# Patient Record
Sex: Female | Born: 1975 | Race: Black or African American | Hispanic: No | Marital: Married | Smoking: Current every day smoker
Health system: Southern US, Community
[De-identification: ages and names within clinical notes are randomized; demographics above are authoritative.]

## PROBLEM LIST (undated history)

## (undated) DIAGNOSIS — R002 Palpitations: Secondary | ICD-10-CM

## (undated) DIAGNOSIS — R0789 Other chest pain: Secondary | ICD-10-CM

## (undated) HISTORY — PX: TUBAL LIGATION: SHX77

## (undated) HISTORY — DX: Other chest pain: R07.89

## (undated) HISTORY — DX: Palpitations: R00.2

## (undated) HISTORY — PX: BREAST REDUCTION SURGERY: SHX8

## (undated) HISTORY — PX: BREAST SURGERY: SHX581

---

## 1998-06-07 ENCOUNTER — Emergency Department (HOSPITAL_COMMUNITY): Admission: EM | Admit: 1998-06-07 | Discharge: 1998-06-07 | Payer: Self-pay | Admitting: Emergency Medicine

## 1998-09-01 ENCOUNTER — Encounter: Payer: Self-pay | Admitting: Emergency Medicine

## 1998-09-01 ENCOUNTER — Emergency Department (HOSPITAL_COMMUNITY): Admission: EM | Admit: 1998-09-01 | Discharge: 1998-09-01 | Payer: Self-pay | Admitting: Emergency Medicine

## 2000-01-07 ENCOUNTER — Emergency Department (HOSPITAL_COMMUNITY): Admission: EM | Admit: 2000-01-07 | Discharge: 2000-01-07 | Payer: Self-pay | Admitting: Emergency Medicine

## 2000-07-07 ENCOUNTER — Emergency Department (HOSPITAL_COMMUNITY): Admission: EM | Admit: 2000-07-07 | Discharge: 2000-07-07 | Payer: Self-pay | Admitting: *Deleted

## 2000-07-13 ENCOUNTER — Inpatient Hospital Stay (HOSPITAL_COMMUNITY): Admission: AD | Admit: 2000-07-13 | Discharge: 2000-07-13 | Payer: Self-pay | Admitting: *Deleted

## 2000-08-20 ENCOUNTER — Inpatient Hospital Stay (HOSPITAL_COMMUNITY): Admission: EM | Admit: 2000-08-20 | Discharge: 2000-08-22 | Payer: Self-pay | Admitting: *Deleted

## 2000-08-30 ENCOUNTER — Ambulatory Visit (HOSPITAL_COMMUNITY): Admission: RE | Admit: 2000-08-30 | Discharge: 2000-08-30 | Payer: Self-pay | Admitting: *Deleted

## 2000-09-03 ENCOUNTER — Inpatient Hospital Stay (HOSPITAL_COMMUNITY): Admission: AD | Admit: 2000-09-03 | Discharge: 2000-09-03 | Payer: Self-pay | Admitting: Obstetrics

## 2000-09-12 ENCOUNTER — Encounter: Payer: Self-pay | Admitting: Emergency Medicine

## 2000-09-12 ENCOUNTER — Emergency Department (HOSPITAL_COMMUNITY): Admission: EM | Admit: 2000-09-12 | Discharge: 2000-09-12 | Payer: Self-pay | Admitting: Emergency Medicine

## 2000-10-06 ENCOUNTER — Inpatient Hospital Stay (HOSPITAL_COMMUNITY): Admission: AD | Admit: 2000-10-06 | Discharge: 2000-10-06 | Payer: Self-pay | Admitting: Obstetrics

## 2000-10-25 ENCOUNTER — Inpatient Hospital Stay (HOSPITAL_COMMUNITY): Admission: AD | Admit: 2000-10-25 | Discharge: 2000-10-25 | Payer: Self-pay | Admitting: Obstetrics & Gynecology

## 2000-11-05 ENCOUNTER — Inpatient Hospital Stay (HOSPITAL_COMMUNITY): Admission: AD | Admit: 2000-11-05 | Discharge: 2000-11-05 | Payer: Self-pay | Admitting: Obstetrics

## 2000-11-16 ENCOUNTER — Inpatient Hospital Stay (HOSPITAL_COMMUNITY): Admission: AD | Admit: 2000-11-16 | Discharge: 2000-11-19 | Payer: Self-pay | Admitting: *Deleted

## 2000-11-17 ENCOUNTER — Encounter: Payer: Self-pay | Admitting: *Deleted

## 2000-11-29 ENCOUNTER — Encounter: Admission: RE | Admit: 2000-11-29 | Discharge: 2000-11-29 | Payer: Self-pay | Admitting: Obstetrics

## 2000-12-13 ENCOUNTER — Encounter: Admission: RE | Admit: 2000-12-13 | Discharge: 2000-12-13 | Payer: Self-pay | Admitting: Obstetrics

## 2000-12-25 ENCOUNTER — Inpatient Hospital Stay (HOSPITAL_COMMUNITY): Admission: RE | Admit: 2000-12-25 | Discharge: 2000-12-25 | Payer: Self-pay | Admitting: Obstetrics

## 2000-12-27 ENCOUNTER — Encounter (HOSPITAL_COMMUNITY): Admission: RE | Admit: 2000-12-27 | Discharge: 2001-01-16 | Payer: Self-pay | Admitting: Obstetrics

## 2000-12-27 ENCOUNTER — Encounter: Admission: RE | Admit: 2000-12-27 | Discharge: 2000-12-27 | Payer: Self-pay | Admitting: Obstetrics

## 2001-01-01 ENCOUNTER — Inpatient Hospital Stay (HOSPITAL_COMMUNITY): Admission: AD | Admit: 2001-01-01 | Discharge: 2001-01-01 | Payer: Self-pay | Admitting: *Deleted

## 2001-01-03 ENCOUNTER — Encounter: Admission: RE | Admit: 2001-01-03 | Discharge: 2001-01-03 | Payer: Self-pay | Admitting: Obstetrics

## 2001-01-05 ENCOUNTER — Inpatient Hospital Stay (HOSPITAL_COMMUNITY): Admission: AD | Admit: 2001-01-05 | Discharge: 2001-01-05 | Payer: Self-pay | Admitting: *Deleted

## 2001-01-10 ENCOUNTER — Encounter: Admission: RE | Admit: 2001-01-10 | Discharge: 2001-01-10 | Payer: Self-pay | Admitting: Obstetrics

## 2001-01-14 ENCOUNTER — Inpatient Hospital Stay (HOSPITAL_COMMUNITY): Admission: AD | Admit: 2001-01-14 | Discharge: 2001-01-17 | Payer: Self-pay | Admitting: Obstetrics & Gynecology

## 2001-04-06 ENCOUNTER — Emergency Department (HOSPITAL_COMMUNITY): Admission: EM | Admit: 2001-04-06 | Discharge: 2001-04-06 | Payer: Self-pay | Admitting: Emergency Medicine

## 2001-07-20 ENCOUNTER — Emergency Department (HOSPITAL_COMMUNITY): Admission: EM | Admit: 2001-07-20 | Discharge: 2001-07-20 | Payer: Self-pay | Admitting: Emergency Medicine

## 2002-09-13 ENCOUNTER — Emergency Department (HOSPITAL_COMMUNITY): Admission: EM | Admit: 2002-09-13 | Discharge: 2002-09-13 | Payer: Self-pay | Admitting: Emergency Medicine

## 2002-11-17 ENCOUNTER — Encounter (INDEPENDENT_AMBULATORY_CARE_PROVIDER_SITE_OTHER): Payer: Self-pay | Admitting: *Deleted

## 2002-11-17 ENCOUNTER — Ambulatory Visit (HOSPITAL_BASED_OUTPATIENT_CLINIC_OR_DEPARTMENT_OTHER): Admission: RE | Admit: 2002-11-17 | Discharge: 2002-11-18 | Payer: Self-pay | Admitting: Specialist

## 2003-03-18 ENCOUNTER — Emergency Department (HOSPITAL_COMMUNITY): Admission: EM | Admit: 2003-03-18 | Discharge: 2003-03-18 | Payer: Self-pay | Admitting: Emergency Medicine

## 2003-03-18 ENCOUNTER — Encounter: Payer: Self-pay | Admitting: Emergency Medicine

## 2003-06-12 ENCOUNTER — Emergency Department (HOSPITAL_COMMUNITY): Admission: EM | Admit: 2003-06-12 | Discharge: 2003-06-12 | Payer: Self-pay

## 2003-06-12 ENCOUNTER — Encounter: Payer: Self-pay | Admitting: *Deleted

## 2004-04-27 ENCOUNTER — Emergency Department (HOSPITAL_COMMUNITY): Admission: EM | Admit: 2004-04-27 | Discharge: 2004-04-28 | Payer: Self-pay | Admitting: Emergency Medicine

## 2005-03-08 ENCOUNTER — Emergency Department (HOSPITAL_COMMUNITY): Admission: EM | Admit: 2005-03-08 | Discharge: 2005-03-08 | Payer: Self-pay | Admitting: Family Medicine

## 2005-08-25 ENCOUNTER — Emergency Department (HOSPITAL_COMMUNITY): Admission: EM | Admit: 2005-08-25 | Discharge: 2005-08-25 | Payer: Self-pay | Admitting: Family Medicine

## 2007-07-20 ENCOUNTER — Emergency Department (HOSPITAL_COMMUNITY): Admission: EM | Admit: 2007-07-20 | Discharge: 2007-07-20 | Payer: Self-pay | Admitting: Emergency Medicine

## 2007-07-21 ENCOUNTER — Emergency Department (HOSPITAL_COMMUNITY): Admission: EM | Admit: 2007-07-21 | Discharge: 2007-07-21 | Payer: Self-pay | Admitting: Emergency Medicine

## 2008-03-27 ENCOUNTER — Inpatient Hospital Stay (HOSPITAL_COMMUNITY): Admission: AD | Admit: 2008-03-27 | Discharge: 2008-03-27 | Payer: Self-pay | Admitting: Obstetrics

## 2008-04-28 ENCOUNTER — Emergency Department (HOSPITAL_COMMUNITY): Admission: EM | Admit: 2008-04-28 | Discharge: 2008-04-28 | Payer: Self-pay | Admitting: Family Medicine

## 2008-06-13 ENCOUNTER — Inpatient Hospital Stay (HOSPITAL_COMMUNITY): Admission: AD | Admit: 2008-06-13 | Discharge: 2008-06-13 | Payer: Self-pay | Admitting: Obstetrics

## 2008-08-21 ENCOUNTER — Inpatient Hospital Stay (HOSPITAL_COMMUNITY): Admission: AD | Admit: 2008-08-21 | Discharge: 2008-08-21 | Payer: Self-pay | Admitting: Obstetrics

## 2008-11-16 ENCOUNTER — Inpatient Hospital Stay (HOSPITAL_COMMUNITY): Admission: AD | Admit: 2008-11-16 | Discharge: 2008-11-18 | Payer: Self-pay | Admitting: Obstetrics

## 2008-11-17 ENCOUNTER — Encounter (INDEPENDENT_AMBULATORY_CARE_PROVIDER_SITE_OTHER): Payer: Self-pay | Admitting: Obstetrics and Gynecology

## 2009-05-24 ENCOUNTER — Emergency Department (HOSPITAL_COMMUNITY): Admission: EM | Admit: 2009-05-24 | Discharge: 2009-05-24 | Payer: Self-pay | Admitting: Family Medicine

## 2009-07-27 ENCOUNTER — Emergency Department (HOSPITAL_COMMUNITY): Admission: EM | Admit: 2009-07-27 | Discharge: 2009-07-27 | Payer: Self-pay | Admitting: Emergency Medicine

## 2009-10-28 ENCOUNTER — Encounter: Admission: RE | Admit: 2009-10-28 | Discharge: 2009-10-28 | Payer: Self-pay | Admitting: Family Medicine

## 2011-02-12 LAB — POCT PREGNANCY, URINE

## 2011-02-20 LAB — CBC
HCT: 32.1 % — ABNORMAL LOW (ref 36.0–46.0)
Hemoglobin: 10.8 g/dL — ABNORMAL LOW (ref 12.0–15.0)
Hemoglobin: 11.7 g/dL — ABNORMAL LOW (ref 12.0–15.0)
MCV: 99.8 fL (ref 78.0–100.0)
RBC: 3.21 MIL/uL — ABNORMAL LOW (ref 3.87–5.11)
RBC: 3.55 MIL/uL — ABNORMAL LOW (ref 3.87–5.11)
RDW: 12.6 % (ref 11.5–15.5)
WBC: 10.8 10*3/uL — ABNORMAL HIGH (ref 4.0–10.5)
WBC: 7.6 10*3/uL (ref 4.0–10.5)

## 2011-03-21 NOTE — Op Note (Signed)
NAMEDEBHORA, Sharon Bryant              ACCOUNT NO.:  [id number]   MEDICAL RECORD NO.:  [id number]          PATIENT TYPE:  INP   LOCATION:  9109                          FACILITY:  [facility]   PHYSICIAN:  Kathreen Cosier, M.D.DATE OF BIRTH:  1976-08-15   DATE OF PROCEDURE:  11/17/2008  DATE OF DISCHARGE:                               OPERATIVE REPORT   PREOPERATIVE DIAGNOSIS:  Multiparity.   POSTOPERATIVE DIAGNOSIS:  Multiparity.   SURGEON:  Kathreen Cosier, M.D.   PROCEDURE:  Postpartum tubal ligation.   The patient was placed on the operating table in supine position after  the epidural dose.  A midline subumbilical incision 1-inch long was made  and carried down through the fascia.  The fascia cleaned, grasped with 2  Kocher's.  Fascia and the peritoneum were opened with Mayo scissors.  Left tube grasped in the mid portion with a Babcock clamp, 0 plain  suture placed at the mesosalpinx below the portion of the tube and  clamp.  This was tied and approximately 1 inch of tube transected.  Procedure was done in the similar fashion in the other side.  Hemostasis  was satisfactory.  Lap and sponge counts correct.  Abdomen was closed in  layers.  Peritoneum and fascia, continuous suture of 0 Dexon.  Skin  closed with subcuticular stitch of 4-0 Monocryl.  Blood loss minimal.  The patient tolerated the procedure well and was taken to recovery room  in good condition.           ______________________________  Kathreen Cosier, M.D.     BAM/MEDQ  D:  11/17/2008  T:  11/18/2008  Job:  [id number]

## 2011-03-24 NOTE — Discharge Summary (Signed)
[facility]  Patient:    Sharon Bryant, Sharon Bryant                     MRN: [id number] Adm. Date:  [id number] Disc. Date: [id number] Attending:  Sandi Raveling                           Discharge Summary  BRIEF HISTORY:  Patient is a 35 year old single black female admitted with a history of depression and suicide attempt.  She reported feelings of depression and had suicidal thoughts and overdosed on eight Tylenol PM tablets.  She spoke to her sister who called EMS and she was taken to the emergency department.  The patient reported multiple stressors with her job and relationships.  She reported that she had been sleeping fairly well and that her appetite had been good.  She felt hopeless about her future.  Patient had history of depression since the age of 33.  She had been on medication in the past but was unsure of which type.  She had been admitted to [facility] years ago for depression and also been followed through the [facility].  She was [facility] medically [facility] [place].  She had no medical problems other than intrauterine pregnancy.  She was on prenatal vitamins.  She had no known drug allergies.  PHYSICAL EXAMINATION:  Performed at [facility] Emergency Department with no significant findings.  LABORATORY VALUES:  Her acetaminophen level was 18.2.  MENTAL STATUS EXAMINATION:  On admission, revealed an alert and casually-dressed, young black female.  Speech was normal.  Thought processes were logical and coherent without evidence of psychosis.  She had some suicidal thoughts but no intent.  Mood was depressed.  Affect was sad. Oriented x 3.  Cognitive function was intact.  ADMITTING DIAGNOSES: Axis I:    Depression not otherwise specified. Axis II:   Deferred. Axis III:  Intrauterine pregnancy. Axis IV:   Psychosocial stressors moderate. Axis V:    Global Assessment of Functioning:  Current 30;  highest in past year            60.  TREATMENT PLAN:  The patient was admitted for treatment of her depression and to prevent any suicidal acting out.  She was in groups and was able to talk about some of her stressors at home.  She did not wish to get on antidepressant medications.  She continued to have some difficulty sleeping but her appetite was good.  She was denying suicidal ideation.  Her mood and affect seemed brighter.  We respected her wish to remain off of antidepressants but she did agree to talk with her obstetrician about that possibility.  CONDITION ON DISCHARGE:  Patient discharged in mildly improved condition with some improvement in her mood and alleviation of her acute suicidal ideation.  DISPOSITION:  Patient is discharged home.  She was to follow up with [facility].  DISCHARGE MEDICATIONS:  Prenatal vitamin 1 q.d.  FINAL DIAGNOSES: Axis I:    Depression not otherwise specified. Axis II:   No diagnosis. Axis III:  Intrauterine pregnancy. Axis IV:   Psychosocial stressors moderate. Axis V:    Global Assessment of Functioning:  Current 50; highest past year            60. DD:  10/02/00 TD:  10/02/00 Job: [id number]

## 2011-03-24 NOTE — H&P (Signed)
[facility]  Patient:    Sharon Bryant, Sharon Bryant                     MRN: [id number] Adm. Date:  [id number] Attending:  Otilio Saber Dictator:   Landry Corporal, NP                   Psychiatric Admission Assessment  DATE OF ADMISSION:  August 20, 2000.  PATIENT IDENTIFICATION:  This is a 35 year old black single female admitted on August 20, 2000 to [facility] for depression and suicide attempt.  HISTORY OF PRESENT ILLNESS:  Patient reports having feelings of depression and feeling "pent up" with depression and decided to kill herself.  Yesterday, she took about 8 Tylenol PM tablets, spoke to her sitter, who called EMS and she was taken to the emergency department.  Patient reports she has been having multiple stressors.  She would not really elaborate as to what they would. She did sort of make references to her job and to her relationships at home. She does state that she has been sleeping OK, that her appetite has been good. No homicidal ideation, negative auditory or visual hallucinations, negative paranoia.  She feels hopeless, with no future in sight.  PAST PSYCHIATRIC HISTORY:  She says she has a history of depression since the age of 32.  She was placed on some medication but was uncertain as to what the medication was.  She also had an admission to [facility] years ago for for depression and was also going to [facility] center. Again, this was remote problems that she states.  SUBSTANCE ABUSE HISTORY:  No alcohol use and denies street drugs.  PAST MEDICAL HISTORY:  Primary care physician:  She goes to [facility] in [place].  Her medical problems:  None, other than her intrauterine pregnancy.  Medications:  She takes prenatal vitamins.  Drug allergies:  None.  PHYSICAL EXAMINATION:  Her physical examination was done at [facility] emergency department.  Her acetaminophen level was 18.2.  SOCIAL  HISTORY:  A 35 year old black female.  She is single.  She has a relationship with a female for the past 7-8 years.  She has two children, ages 53 and 2.  She says he is the father of both of these children, and she is also pregnant x 12 and he is the father of this child as well.  Her estimated date of confinement is March 05, 2001.  She works as a Conservation officer, nature at [employer].  She has a ninth grade education.  She smokes one half pack for about six years.  She lives with her father and two children.  FAMILY HISTORY:  No psychiatric problems that she is aware of.  MENTAL STATUS EXAMINATION:  She is alert and casually dressed young black female, fair eye contact.  Speech is normal and relevant.  Her mood is depressed.  Her affect is sad.  Thought processes are intact.  No evidence of psychosis.  Negative auditory or visual hallucinations.  Cognitive, intact x 3.  Her memory is intact.  Her judgment is poor.  Her insight is poor.  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 Axis II:   Deferred. Axis III:  Intrauterine pregnancy. Axis IV:   Moderate, related to problems with primary support group, social            environment, occupational problem, and economic problems. Axis V:  Current global assessment of function is 30.  INITIAL PLAN OF CARE:  Voluntary admission to [facility] for depression and suicide attempt.  She is to contract for safety, check every 15 minutes.  Patient is in agreement.  We will also obtain a thyroid profile and begin her prenatal vitamins.  ESTIMATED LENGTH OF STAY:  Three to four days.  CONDITIONS NECESSARY FOR DISCHARGE:  POST HOSPITAL CARE PLAN: DD:  08/21/00 TD:  08/21/00 Job: [id number]

## 2011-03-24 NOTE — Op Note (Signed)
NAMEDASHIA, Sharon Bryant                        ACCOUNT NO.:  [id number]   MEDICAL RECORD NO.:  [id number]                   PATIENT TYPE:  AMB   LOCATION:  DSC                                  FACILITY:  [facility]   PHYSICIAN:  Earvin Hansen L. Shon Hough, M.D.           DATE OF BIRTH:  [birth year]   DATE OF PROCEDURE:  11/17/2002  DATE OF DISCHARGE:                                 OPERATIVE REPORT   INDICATIONS FOR PROCEDURE:  This is a 35 year old lady with severe, severe  macromastia, back and shoulder pain secondary to large pendulous breasts -  the left side greater than the right.   PROCEDURES PLANNED:  Bilateral breast reductions using the inferior pedicle  technique.   ANESTHESIA:  General.   SURGEON:  Gerald L. Shon Hough, M.D.   ASSISTANT:  Alethia Berthold, C.F.A.   DESCRIPTION OF PROCEDURE:  Preoperatively the patient was set up and drawn  for the inferior pedicle reduction mammoplasty, remarking the nipple areolar  complexes from over 38 cm up to 20 cm from the suprasternal notch.  She then  underwent general anesthesia and intubated orally.  Prep was done to the  chest and breast areas in a routine fashion using Betadine soap and  solution, walled off with sterile towels, and draped so as to make a sterile  field.  Xylocaine 0.25% with epinephrine was injected locally - a total of  150 cc per side.  The wounds were scored with #20 blades.  Then, the skin  over the inferior pedicles was de-epithelialized with #20 blades.  The  medial and lateral fatty dermal pedicles were excised down to underlying  fascia.  After this, out laterally more tissue was taken to improve the  lateral contour.  The new keyhole was also debulked.  After this and after  proper hemostasis the flaps were transposed and stayed with 3-0 Prolene,  subcutaneous closure was done with 3-0 Monocryl x2 layers, then a running  subcuticular stitch of 3-0 Monocryl throughout the inverted T.  The same  procedure was  carried out on both sides, removing over 1500 g on the left  and over 1100 g on the right.  The wounds were cleanses.  Benzoin was  applied and then half inch Steri-Strips, Xeroform, 4x4's, ABDs, and Hypafix  tape.  The wounds were also drained with #10 Blake drains, fully fluted,  which were placed in the depths of the wound and brought out through the  lateral-most portion of the incision and secured with 3-0 Prolene.  The  patient was then taken to recovery in excellent condition.   DISPOSITION:  She will be admitted for overnight stay and then will be  discharged tomorrow, follow up in the office on this Thursday for  reexamination.  Yaakov Guthrie. Shon Hough, M.D.    Cathie Hoops  D:  11/17/2002  T:  11/17/2002  Job:  [id number]

## 2011-03-24 NOTE — Discharge Summary (Signed)
[facility]  Patient:    Sharon Bryant, Sharon Bryant                     MRN: [id number] Adm. Date:  [id number] Disc. Date: 11/19/00 Attending:  Michaelle Copas Dictator:   Jamey Reas, M.D.                           Discharge Summary  DATE OF BIRTH:                02/06/1976  ADMISSION DIAGNOSES:          1. Preterm contractions.                               2. Positive group B strep.  DISCHARGE DIAGNOSES:          1. Preterm contractions.                               2. Positive group B strep.  REFERRING PHYSICIAN:          [facility], [facility].  CONSULTATIONS:                None.  PROCEDURES:                   OB ultrasound on November 17, 2000, showed single fetus, female, in cephalic presentation, grade 1 placenta, normal AFI at 13.8, normal growth parameters at 39th percentile for 29-week fetus.  Cervical length was found to be 3.7 cm.  HOSPITAL COURSE:              The patient was admitted on January 11, for preterm contractions.  The patient had a wet prep and GBS.  GC and Chlamydia cultures were canceled.  Wet prep showed only a few wbcs with moderate bacteria, otherwise negative for Trichomonas, clue cells.  The patient was started on Unasyn 3 g IV q.6h.  This was continued through day #2 of hospitalization at which time her IV infiltrated.  She was taken off of the Unasyn at that time.  The patient remained with only uterine irritability on occasion throughout hospitalization.  She was not noted to contract significantly throughout most of her hospitalization.  She is a G3, P 2-0-0-2 at 29 and 4/7 at the time of discharge.  Urinalysis on admission was within normal limits.  Her contractions on admission had been times several weeks, but increased on the morning and had become painful on the morning of admission.  She noted intact membranes and had normal fetal activity.  DISCHARGE  INSTRUCTIONS:       The patient will be discharged home on preterm labor precautions with bed rest with only bathroom privileges.  She will remain out of work until seen for her follow-up appointment.  DISCHARGE MEDICATIONS:        Prenatal vitamins and iron.  She will take TUMS p.r.n.  DISCHARGE FOLLOWUP:           Instructions as above.  Follow up with high risk clinic, Dr. Clearance Coots, scheduled for November 29, 2000, at 9 a.m. DD:  11/19/00 TD:  11/19/00 Job: [id number]

## 2011-08-02 LAB — CBC
HCT: 34.5 — ABNORMAL LOW
Hemoglobin: 11.9 — ABNORMAL LOW
MCHC: 34.4
RBC: 3.58 — ABNORMAL LOW

## 2011-08-02 LAB — WET PREP, GENITAL
Trich, Wet Prep: NONE SEEN
Yeast Wet Prep HPF POC: NONE SEEN

## 2011-08-02 LAB — ABO/RH: ABO/RH(D): O POS

## 2011-08-02 LAB — POCT PREGNANCY, URINE: Operator id: 242691

## 2011-08-02 LAB — GC/CHLAMYDIA PROBE AMP, GENITAL

## 2011-08-07 LAB — URINALYSIS, ROUTINE W REFLEX MICROSCOPIC
Glucose, UA: NEGATIVE
Hgb urine dipstick: NEGATIVE
Ketones, ur: 15 — AB
Protein, ur: NEGATIVE

## 2011-12-11 ENCOUNTER — Ambulatory Visit: Payer: Managed Care, Other (non HMO) | Admitting: Family Medicine

## 2011-12-11 VITALS — BP 124/76 | HR 82 | Temp 98.1°F | Resp 16 | Ht 64.0 in | Wt 193.4 lb

## 2011-12-11 DIAGNOSIS — Z0000 Encounter for general adult medical examination without abnormal findings: Secondary | ICD-10-CM

## 2011-12-11 DIAGNOSIS — B001 Herpesviral vesicular dermatitis: Secondary | ICD-10-CM

## 2011-12-11 DIAGNOSIS — B009 Herpesviral infection, unspecified: Secondary | ICD-10-CM

## 2011-12-11 MED ORDER — FAMCICLOVIR 500 MG PO TABS: 250.0000 mg | ORAL_TABLET | Freq: Three times a day (TID) | ORAL | Status: AC

## 2011-12-11 NOTE — Progress Notes (Signed)
This is a 36 year old woman who works at [employer]. She presents today for swelling and irritation on her upper lip. This began 48 hours prior to arrival. She feels embarrassed to go to work.  Objective: Patient has a small group of vesicular blisters on her upper lip and moderate upper lip swelling. She also has a enlarged sub mental lymph node.  Assessment: HSV 1, acute.  Plan: Famvir as noted.

## 2011-12-11 NOTE — Patient Instructions (Signed)
Fever Blisters, Herpes Simplex Herpes simplex is a virus. This virus causes fever blisters or cold sores. Fever blisters are small sores on the lips, gums, or roof of the mouth. People often get infected with this herpes virus but do not have any symptoms. The blisters may break out when a person is:  Tired.   Under stress.   Suffering from another infection (such as a cold).   Exposed to sunlight.  The blisters usually heal within 1 week. The virus can be easily passed to other people and to other parts of the body, such as the eyes and sex organs. CAUSES  A virus, herpes simplex, is the cause of fever blisters. This virus can be passed (transmitted) from person to person and is therefore contagious. There are 2 types of herpes simplex virus. Type 1 usually causes oral herpes or fever blisters. Type 2 usually causes genital herpes. Both viruses do have the potential to cause oral and genital infections. However, the type 1 virus causes more than 90% of recurrent fever blister outbreaks.  Herpes simplex virus is highly contagious when fever blisters are present. Close contact, including kissing, can spread the virus. Children often become infected by contact with others who have fever blisters. A child can spread the virus by rubbing the cold sore and touching other children or when other children touch clothing, wipes, or toys contaminated by an infected child with the virus. In adults, about 10% of oral herpes infections are from oral-genital sex with a person who has active genital herpes (type 2).  Type 1 herpes infection is very common, eventually occurring in up to 8 out of 10 otherwise healthy people. Most people become infected before they are 34 years old. The virus usually infects the lips, throat, or mouth. Initial infection in children can be extensive with many lesions throughout the mouth. In adults, the first infection may cause no symptoms. Some adults may develop many fluid-filled  blisters inside and outside the mouth 3 to 5 days after they are initially infected but severe infection is uncommon. Fever, swollen neck glands, and general aches may occur but this is also uncommon. The blisters tend to come together and then collapse. When on the lip, a yellowish crust forms over the sores. Healing of the area without scarring typically occurs within 2 weeks. Once a person is infected, the herpes virus permanently remains alive in the body within a nerve near the cheekbone. It then stays inactive at this site, only to sometimes travel down the nerve to the skin. This causes a recurrence of fever blisters. Recurrent blisters usually break out at the outside edge of the lip or edge of the nostril. Recurrent fever blisters may occasionally occur on the chin, cheeks, or inside the mouth. Recurrent fever blister attacks are usually not as painful and not as numerous as the first infection. Recurrences are less frequent after age 35. Many people who have recurring fever blisters feel itching, tingling, or burning at the lip border. This can occur hours or a couple days before the blister appears.  Factors which weaken the body's immune system may trigger an outbreak or recurrence of herpes. These include some drugs (such as steroids), emotional stress, fever, illness, sleep deprivation, and other injuries. Sunlight may also trigger an outbreak. Many women have recurrences only during their menstrual period.  TREATMENT There is no cure for fever blisters. There is no vaccine for herpes simplex virus.  Certain medicines can relieve some of the pain   and discomfort of the sores or promote more rapid healing. These include ointments that numb the blisters and medicines that control bacterial infections (antibiotics). A number of drugs active against herpes viruses (antivirals), either applied locally as a gel or cream, or taken in pill form, may promote healing by keeping the virus from multiplying  and infecting more local tissue.   Keep fever blisters clean and dry. This helps to prevent bacterial invasion of the virally infected tissues.   Eat a soft, bland diet to avoid irritating the sores.   Be careful not to touch the sores and spread the virus to new sites, such as:   Other areas of the face.   Eyes.   Genitals.   Make sure you do not infect others. Avoid kissing people when a fever blister is present. Avoid touching the sores and then touching others.   Sunscreen on the lips can prevent recurrences if outbreaks are triggered by sunlight. The sunscreen should be put on before going outside and reapplied often while in the sun.   Avoid stress if this seems to cause outbreaks.  HOME CARE INSTRUCTIONS   Only take over-the-counter or prescription medicines for pain, discomfort, or fever as directed by your caregiver. Do not use aspirin.   Do not touch the blisters or pick the scabs. Wash your hands often. Do not touch your eyes without washing your hands first.   Avoid close contact with other people, especially kissing, until blisters heal.   Hot, cold, or salty foods may hurt your mouth. Use a straw to drink. Eating a well-balanced diet will help healing.  SEEK MEDICAL CARE IF:   Your eye feels irritated, painful, or you feel like you have something in your eye.   You develop a fever, feel achy, or see pus instead of clear fluid in the sores. These are signs of a bacterial infection.   You get blisters on your genitals.   You develop new, unexplained symptoms.  MAKE SURE YOU:   Understand these instructions.   Will watch your condition.   Will get help right away if you are not doing well or get worse.  Document Released: 10/23/2005 Document Revised: 07/05/2011 Document Reviewed: 02/27/2008 Exit[employer] Patient Information 2012 [employer]. 

## 2012-07-29 ENCOUNTER — Encounter (HOSPITAL_COMMUNITY): Payer: Self-pay

## 2012-07-29 ENCOUNTER — Emergency Department (HOSPITAL_COMMUNITY)
Admission: EM | Admit: 2012-07-29 | Discharge: 2012-07-29 | Disposition: A | Discharge status: Home / Self Care | Payer: Self-pay | Source: Home / Self Care | Attending: Family Medicine | Admitting: Family Medicine

## 2012-07-29 DIAGNOSIS — L03317 Cellulitis of buttock: Secondary | ICD-10-CM

## 2012-07-29 DIAGNOSIS — L0231 Cutaneous abscess of buttock: Secondary | ICD-10-CM

## 2012-07-29 MED ORDER — SULFAMETHOXAZOLE-TRIMETHOPRIM 800-160 MG PO TABS: 1.0000 | ORAL_TABLET | Freq: Two times a day (BID) | ORAL | Status: DC

## 2012-07-29 NOTE — ED Provider Notes (Signed)
History     CSN: [id number]  Arrival date & time 07/29/12  1553   First MD Initiated Contact with Patient 07/29/12 1742      Chief Complaint  Patient presents with  . Sore    (Consider location/radiation/quality/duration/timing/severity/associated sxs/prior treatment) The history is provided by the patient.   This patient presents for evaluation of a cutaneous abscess.  The lesion is located on the right buttocks.  Onset was 2 days ago.  Symptoms have worsened.  Abscess has associated symptoms of tenderness.  The patient does have previous history of cutaneous abscesses.  There is no known previous history of MRSA.   They do not have a history of diabetes. Has applied warm compresses and warm soaks with no change.  Has had I and D of previous abscesses.  History reviewed. No pertinent past medical history.  History reviewed. No pertinent past surgical history.  No family history on file.  History  Substance Use Topics  . Smoking status: Current Every Day Smoker -- 15.0 packs/day    Types: Cigarettes  . Smokeless tobacco: Not on file  . Alcohol Use: Not on file    OB History    Grav Para Term Preterm Abortions TAB SAB Ect Mult Living                  Review of Systems  Constitutional: Negative.   Respiratory: Negative.   Cardiovascular: Negative.   Musculoskeletal: Negative.   Skin: Negative.     Allergies  Review of patient's allergies indicates no known allergies.  Home Medications   Current Outpatient Rx  Name Route Sig Dispense Refill  . SULFAMETHOXAZOLE-TRIMETHOPRIM 800-160 MG PO TABS Oral Take 1 tablet by mouth 2 (two) times daily. 20 tablet 0    BP 120/80  Pulse 98  Temp 99.5 F (37.5 C) (Oral)  Resp 18  SpO2 100%  LMP 07/22/2012  Physical Exam  Nursing note and vitals reviewed. Constitutional: She is oriented to person, place, and time. Vital signs are normal. She appears well-developed and well-nourished. She is active and  cooperative.  HENT:  Head: Normocephalic.  Eyes: Conjunctivae normal are normal. Pupils are equal, round, and reactive to light. No scleral icterus.  Neck: Trachea normal. Neck supple.  Cardiovascular: Normal rate and regular rhythm.   Pulmonary/Chest: Effort normal and breath sounds normal.  Neurological: She is alert and oriented to person, place, and time. No cranial nerve deficit or sensory deficit.  Skin: Skin is warm and dry.          4 x 6cm indurated abscess to right gluteal cleft, tender to palpation.   Psychiatric: She has a normal mood and affect. Her speech is normal and behavior is normal. Judgment and thought content normal. Cognition and memory are normal.    ED Course  Procedures (including critical care time)  Labs Reviewed - No data to display No results found.   1. Abscess, gluteal cleft       MDM  Continue warm soaks twice daily, begin antibiotics as prescribed, rtc in 3-4 days for wound recheck.  May need I&D at that time.  Quit smoking.        Johnsie Kindred, NP 07/29/12 1759

## 2012-07-29 NOTE — ED Notes (Signed)
Patient states she has a abscess on her bottom, started 2 days ago

## 2012-08-02 NOTE — ED Provider Notes (Signed)
Medical screening examination/treatment/procedure(s) were performed by resident physician or non-physician practitioner and as supervising physician I was immediately available for consultation/collaboration.   Barkley Bruns MD.    Linna Hoff, MD 08/02/12 [phone]

## 2013-03-04 ENCOUNTER — Encounter (HOSPITAL_COMMUNITY): Payer: Self-pay | Admitting: Emergency Medicine

## 2013-03-04 ENCOUNTER — Emergency Department (HOSPITAL_COMMUNITY)
Admission: EM | Admit: 2013-03-04 | Discharge: 2013-03-05 | Disposition: A | Discharge status: Home / Self Care | Payer: Managed Care, Other (non HMO) | Attending: Emergency Medicine | Admitting: Emergency Medicine

## 2013-03-04 DIAGNOSIS — R112 Nausea with vomiting, unspecified: Secondary | ICD-10-CM | POA: Insufficient documentation

## 2013-03-04 DIAGNOSIS — R10816 Epigastric abdominal tenderness: Secondary | ICD-10-CM | POA: Insufficient documentation

## 2013-03-04 DIAGNOSIS — R109 Unspecified abdominal pain: Secondary | ICD-10-CM

## 2013-03-04 DIAGNOSIS — F17200 Nicotine dependence, unspecified, uncomplicated: Secondary | ICD-10-CM | POA: Insufficient documentation

## 2013-03-04 DIAGNOSIS — R748 Abnormal levels of other serum enzymes: Secondary | ICD-10-CM

## 2013-03-04 DIAGNOSIS — R197 Diarrhea, unspecified: Secondary | ICD-10-CM | POA: Insufficient documentation

## 2013-03-04 DIAGNOSIS — Z3202 Encounter for pregnancy test, result negative: Secondary | ICD-10-CM | POA: Insufficient documentation

## 2013-03-04 LAB — COMPREHENSIVE METABOLIC PANEL
AST: 20 U/L (ref 0–37)
BUN: 13 mg/dL (ref 6–23)
CO2: 25 mEq/L (ref 19–32)
Chloride: 104 mEq/L (ref 96–112)
Creatinine, Ser: 0.97 mg/dL (ref 0.50–1.10)
GFR calc non Af Amer: 75 mL/min — ABNORMAL LOW (ref >90)
Total Bilirubin: 0.2 mg/dL — ABNORMAL LOW (ref 0.3–1.2)

## 2013-03-04 LAB — URINALYSIS, ROUTINE W REFLEX MICROSCOPIC
Glucose, UA: NEGATIVE mg/dL
Leukocytes, UA: NEGATIVE
pH: 7.5 (ref 5.0–8.0)

## 2013-03-04 LAB — CBC WITH DIFFERENTIAL/PLATELET
Basophils Absolute: 0.1 10*3/uL (ref 0.0–0.1)
HCT: 35 % — ABNORMAL LOW (ref 36.0–46.0)
Hemoglobin: 12.2 g/dL (ref 12.0–15.0)
Lymphocytes Relative: 47 % — ABNORMAL HIGH (ref 12–46)
Monocytes Absolute: 0.3 10*3/uL (ref 0.1–1.0)
Monocytes Relative: 4 % (ref 3–12)
Neutro Abs: 3.1 10*3/uL (ref 1.7–7.7)
RBC: 3.79 MIL/uL — ABNORMAL LOW (ref 3.87–5.11)
WBC: 7.3 10*3/uL (ref 4.0–10.5)

## 2013-03-04 LAB — POCT PREGNANCY, URINE: Preg Test, Ur: NEGATIVE

## 2013-03-04 MED ORDER — HYDROCODONE-ACETAMINOPHEN 5-325 MG PO TABS: 1.0000 | ORAL_TABLET | Freq: Four times a day (QID) | ORAL | Status: DC | PRN

## 2013-03-04 MED ORDER — FAMOTIDINE IN NACL 20-0.9 MG/50ML-% IV SOLN
20.0000 mg | Freq: Once | INTRAVENOUS | Status: AC
  Administered 2013-03-04: 20 mg via INTRAVENOUS
  Filled 2013-03-04: qty 50

## 2013-03-04 MED ORDER — FAMOTIDINE 20 MG PO TABS: 20.0000 mg | ORAL_TABLET | Freq: Two times a day (BID) | ORAL | Status: DC

## 2013-03-04 MED ORDER — GI COCKTAIL ~~LOC~~
30.0000 mL | Freq: Once | ORAL | Status: AC
  Administered 2013-03-04: 30 mL via ORAL
  Filled 2013-03-04: qty 30

## 2013-03-04 MED ORDER — SODIUM CHLORIDE 0.9 % IV BOLUS (SEPSIS): 1000.0000 mL | Freq: Once | INTRAVENOUS | Status: DC

## 2013-03-04 NOTE — ED Notes (Signed)
PT. REPORTS UPPER ABDOMINAL PAIN WITH NAUSEA , VOMITTING AND DIARRHEA , DENIES FEVER /CHILLS/BODY ACHES.

## 2013-03-04 NOTE — ED Provider Notes (Signed)
History     CSN: [id number]  Arrival date & time 03/04/13  1928   First MD Initiated Contact with Patient 03/04/13 1952      Chief Complaint  Patient presents with  . Abdominal Pain    (Consider location/radiation/quality/duration/timing/severity/associated sxs/prior treatment) HPI  The patient presents with abdominal pain.  The pain began subacutely over the past day.  Since onset the pain has been superior, sore, with persistent nausea.  There've been multiple episodes of emesis, and the patient has postprandial diarrhea.  She is no lower abdominal pain, no dysuria, no hematuria, no blood per rectum or in the vomitus. No fever, no chills, no confusion, no disorientation, no chest pain, no dyspnea.   History reviewed. No pertinent past medical history.  History reviewed. No pertinent past surgical history.  No family history on file.  History  Substance Use Topics  . Smoking status: Current Every Day Smoker -- 15.00 packs/day    Types: Cigarettes  . Smokeless tobacco: Not on file  . Alcohol Use: Yes    OB History   Grav Para Term Preterm Abortions TAB SAB Ect Mult Living                  Review of Systems  Constitutional:       Per HPI, otherwise negative  HENT:       Per HPI, otherwise negative  Respiratory:       Per HPI, otherwise negative  Cardiovascular:       Per HPI, otherwise negative  Gastrointestinal: Positive for nausea, vomiting, abdominal pain and diarrhea.  Endocrine:       Negative aside from HPI  Genitourinary:       Neg aside from HPI   Musculoskeletal:       Per HPI, otherwise negative  Skin: Negative.   Neurological: Negative for syncope.    Allergies  Review of patient's allergies indicates no known allergies.  Home Medications   Current Outpatient Rx  Name  Route  Sig  Dispense  Refill  . sulfamethoxazole-trimethoprim (SEPTRA DS) 800-160 MG per tablet   Oral   Take 1 tablet by mouth 2 (two) times daily.   20 tablet   0      BP 114/73  Pulse 84  Temp(Src) 98.9 F (37.2 C) (Oral)  Resp 16  SpO2 100%  LMP 02/25/2013  Physical Exam  Nursing note and vitals reviewed. Constitutional: She is oriented to person, place, and time. She appears well-developed and well-nourished. No distress.  HENT:  Head: Normocephalic and atraumatic.  Eyes: Conjunctivae and EOM are normal.  Cardiovascular: Normal rate and regular rhythm.   Pulmonary/Chest: Effort normal and breath sounds normal. No stridor. No respiratory distress.  Abdominal: Soft. She exhibits no distension. There is tenderness in the epigastric area. There is no rigidity, no rebound and no guarding.  Musculoskeletal: She exhibits no edema.  Neurological: She is alert and oriented to person, place, and time. No cranial nerve deficit.  Skin: Skin is warm and dry.  Psychiatric: She has a normal mood and affect.    ED Course  Procedures (including critical care time)  Labs Reviewed  CBC WITH DIFFERENTIAL - Abnormal; Notable for the following:    RBC 3.79 (*)    HCT 35.0 (*)    Neutrophils Relative 42 (*)    Lymphocytes Relative 47 (*)    Eosinophils Relative 6 (*)    All other components within normal limits  COMPREHENSIVE METABOLIC PANEL - Abnormal; Notable  for the following:    Glucose, Bld 114 (*)    Alkaline Phosphatase 26 (*)    Total Bilirubin 0.2 (*)    GFR calc non Af Amer 75 (*)    GFR calc Af Amer 87 (*)    All other components within normal limits  LIPASE, BLOOD - Abnormal; Notable for the following:    Lipase 60 (*)    All other components within normal limits  URINALYSIS, ROUTINE W REFLEX MICROSCOPIC  POCT PREGNANCY, URINE   No results found.   No diagnosis found.  O2- 99%ra, normal  10:11 PM Patient calm.  Initial labs notable for elevated lipase  MDM  This patient presents with abdominal pain, nausea, vomiting.  On exam she is in no distress, with a soft abdomen is mild tenderness to palpation about the epigastrium.   She is not peritoneal.  She is afebrile, with unremarkable vital signs. : IV fluid resuscitation, the patient was improved.  Absent any distress, with a soft abdomen, additional imaging was not indicated, though return precautions, follow instructions were explicitly discussed with the patient and her female colleague.  Gerhard Munch, MD 03/04/13 [phone]

## 2013-11-21 ENCOUNTER — Other Ambulatory Visit (HOSPITAL_COMMUNITY)
Admission: RE | Admit: 2013-11-21 | Discharge: 2013-11-21 | Disposition: A | Discharge status: Home / Self Care | Payer: Managed Care, Other (non HMO) | Source: Ambulatory Visit | Attending: Gynecology | Admitting: Gynecology

## 2013-11-21 ENCOUNTER — Ambulatory Visit (INDEPENDENT_AMBULATORY_CARE_PROVIDER_SITE_OTHER): Payer: Managed Care, Other (non HMO) | Admitting: Women's Health

## 2013-11-21 ENCOUNTER — Encounter: Payer: Self-pay | Admitting: Women's Health

## 2013-11-21 VITALS — BP 108/78 | Ht 63.0 in | Wt 187.2 lb

## 2013-11-21 DIAGNOSIS — N898 Other specified noninflammatory disorders of vagina: Secondary | ICD-10-CM

## 2013-11-21 DIAGNOSIS — Z833 Family history of diabetes mellitus: Secondary | ICD-10-CM

## 2013-11-21 DIAGNOSIS — Z01419 Encounter for gynecological examination (general) (routine) without abnormal findings: Secondary | ICD-10-CM

## 2013-11-21 DIAGNOSIS — F17200 Nicotine dependence, unspecified, uncomplicated: Secondary | ICD-10-CM

## 2013-11-21 DIAGNOSIS — F17210 Nicotine dependence, cigarettes, uncomplicated: Secondary | ICD-10-CM

## 2013-11-21 DIAGNOSIS — R350 Frequency of micturition: Secondary | ICD-10-CM

## 2013-11-21 DIAGNOSIS — N949 Unspecified condition associated with female genital organs and menstrual cycle: Secondary | ICD-10-CM

## 2013-11-21 LAB — CBC WITH DIFFERENTIAL/PLATELET
BASOS ABS: 0 10*3/uL (ref 0.0–0.1)
BASOS PCT: 1 % (ref 0–1)
EOS ABS: 0.2 10*3/uL (ref 0.0–0.7)
Eosinophils Relative: 3 % (ref 0–5)
HCT: 37 % (ref 36.0–46.0)
Hemoglobin: 12.7 g/dL (ref 12.0–15.0)
Lymphocytes Relative: 42 % (ref 12–46)
Lymphs Abs: 2.8 10*3/uL (ref 0.7–4.0)
MCH: 32.1 pg (ref 26.0–34.0)
MCHC: 34.3 g/dL (ref 30.0–36.0)
MCV: 93.4 fL (ref 78.0–100.0)
Monocytes Absolute: 0.3 10*3/uL (ref 0.1–1.0)
Monocytes Relative: 5 % (ref 3–12)
NEUTROS ABS: 3.2 10*3/uL (ref 1.7–7.7)
NEUTROS PCT: 49 % (ref 43–77)
PLATELETS: 283 10*3/uL (ref 150–400)
RBC: 3.96 MIL/uL (ref 3.87–5.11)
RDW: 12 % (ref 11.5–15.5)
WBC: 6.5 10*3/uL (ref 4.0–10.5)

## 2013-11-21 LAB — WET PREP FOR TRICH, YEAST, CLUE
CLUE CELLS WET PREP: NONE SEEN
TRICH WET PREP: NONE SEEN
Yeast Wet Prep HPF POC: NONE SEEN

## 2013-11-21 LAB — URINALYSIS W MICROSCOPIC + REFLEX CULTURE
Bilirubin Urine: NEGATIVE
GLUCOSE, UA: NEGATIVE mg/dL
Hgb urine dipstick: NEGATIVE
KETONES UR: NEGATIVE mg/dL
LEUKOCYTES UA: NEGATIVE
NITRITE: NEGATIVE
PH: 5.5 (ref 5.0–8.0)
Protein, ur: NEGATIVE mg/dL
Urobilinogen, UA: 1 mg/dL (ref 0.0–1.0)

## 2013-11-21 LAB — GLUCOSE, RANDOM: GLUCOSE: 84 mg/dL (ref 70–99)

## 2013-11-21 NOTE — Progress Notes (Signed)
Laurette SchimkeBonita R Walston 1976-06-17 [id number]    History:    Presents for annual exam.  New patient. Regular monthly cycle 5 days/BTL. Same partner greater than 9 years. Smokes half pack per day. Abnormal Pap greater than 10 years ago requiring cryo- with normal Paps after.  Past medical history, past surgical history, family history and social history were all reviewed and documented in the EPIC chart. Science writerAssistant manager of convenience store. For sons ages 4017, 6216, 9612, 5 all healthy doing well. Mother thyroid disease. Father diabetes/hypertension/kidney disease.  ROS:  A  ROS was performed and pertinent positives and negatives are included.  Exam:  Filed Vitals:   11/21/13 0908  BP: 108/78    General appearance:  Normal Thyroid:  Symmetrical, normal in size, without palpable masses or nodularity. Respiratory  Auscultation:  Clear without wheezing or rhonchi Cardiovascular  Auscultation:  Regular rate, without rubs, murmurs or gallops  Edema/varicosities:  Not grossly evident Abdominal  Soft,nontender, without masses, guarding or rebound.  Liver/spleen:  No organomegaly noted  Hernia:  None appreciated  Skin  Inspection:  Grossly normal   Breasts: Examined lying and sitting.     Right: Without masses, retractions, discharge or axillary adenopathy.     Left: Without masses, retractions, discharge or axillary adenopathy. Gentitourinary   Inguinal/mons:  Normal without inguinal adenopathy  External genitalia:  Normal  BUS/Urethra/Skene's glands:  Normal  Vagina:  Normal, wet prep negative  Cervix:  Normal  Uterus:   normal in size, shape and contour.  Midline and mobile  Adnexa/parametria:     Rt: Without masses or tenderness.   Lt: Without masses or tenderness.  Anus and perineum: Normal  Digital rectal exam: Normal sphincter tone without palpated masses or tenderness  Assessment/Plan:  38 y.o.SBF G4P4  for annual exam with complaint of vaginal odor after intercourse.  Normal GYN  exam/BTL Cryo- greater than 10 years ago Smoker  Plan: Aware of hazards of smoking will continue to try to decrease to quit. SBE's, regular exercise, calcium rich diet, vitamin D 1000 daily encouraged. CBC, glucose, UA, Pap. Reviewed normality of exam and negative wet prep.    Harrington ChallengerYOUNG,Cinde Ebert J Che[facility]enne Eye SurgeryWHNP, 9:42 AM 11/21/2013

## 2013-11-21 NOTE — Addendum Note (Signed)
Addended by: Richardson ChiquitoWILKINSON, Karishma Unrein S on: 11/21/2013 01:15 PM   Modules accepted: Orders

## 2013-11-21 NOTE — Patient Instructions (Signed)

## 2013-12-19 ENCOUNTER — Encounter (HOSPITAL_BASED_OUTPATIENT_CLINIC_OR_DEPARTMENT_OTHER): Payer: Self-pay | Admitting: Emergency Medicine

## 2013-12-19 ENCOUNTER — Emergency Department (HOSPITAL_BASED_OUTPATIENT_CLINIC_OR_DEPARTMENT_OTHER)
Admission: EM | Admit: 2013-12-19 | Discharge: 2013-12-19 | Disposition: A | Discharge status: Home / Self Care | Payer: Managed Care, Other (non HMO) | Attending: Emergency Medicine | Admitting: Emergency Medicine

## 2013-12-19 DIAGNOSIS — F17200 Nicotine dependence, unspecified, uncomplicated: Secondary | ICD-10-CM | POA: Insufficient documentation

## 2013-12-19 DIAGNOSIS — IMO0001 Reserved for inherently not codable concepts without codable children: Secondary | ICD-10-CM | POA: Insufficient documentation

## 2013-12-19 DIAGNOSIS — J020 Streptococcal pharyngitis: Secondary | ICD-10-CM

## 2013-12-19 DIAGNOSIS — Z79899 Other long term (current) drug therapy: Secondary | ICD-10-CM | POA: Insufficient documentation

## 2013-12-19 LAB — RAPID STREP SCREEN (MED CTR MEBANE ONLY): Streptococcus, Group A Screen (Direct): POSITIVE — AB

## 2013-12-19 MED ORDER — PENICILLIN G BENZATHINE 1200000 UNIT/2ML IM SUSP
1.2000 10*6.[IU] | Freq: Once | INTRAMUSCULAR | Status: AC
  Administered 2013-12-19: 1.2 10*6.[IU] via INTRAMUSCULAR
  Filled 2013-12-19: qty 2

## 2013-12-19 MED ORDER — ACETAMINOPHEN 325 MG PO TABS
650.0000 mg | ORAL_TABLET | Freq: Once | ORAL | Status: AC
  Administered 2013-12-19: 650 mg via ORAL
  Filled 2013-12-19: qty 2

## 2013-12-19 NOTE — ED Notes (Signed)
Sore throat and body aches since last night.

## 2013-12-19 NOTE — ED Provider Notes (Signed)
CSN: [id number]     Arrival date & time 12/19/13  1744 History   First MD Initiated Contact with Patient 12/19/13 1801     Chief Complaint  Patient presents with  . Sore Throat     (Consider location/radiation/quality/duration/timing/severity/associated sxs/prior Treatment) HPI Comments: Patient is a 38 year old female who presents with a 1 day history of sore throat. Patient reports gradual onset and progressively worsening sharp, severe throat pain. The pain is constant and made worse with swallowing. The pain is localized to the patient's throat and equal on both sides. Nothing alleviates the pain. The patient has not tried anything for symptom relief. Patient reports associated subjective fever, cervical adenopathy, and non productive cough. Patient denies headache, visual changes, sinus congestion, difficulty breathing, chest pain, SOB, abdominal pain, NVD.     Patient is a 38 y.o. female presenting with pharyngitis.  Sore Throat Associated symptoms include a fever, myalgias and a sore throat. Pertinent negatives include no abdominal pain, arthralgias, chest pain, chills, fatigue, nausea, neck pain, vomiting or weakness.    History reviewed. No pertinent past medical history. Past Surgical History  Procedure Laterality Date  . Breast reduction surgery    . Tubal ligation    . Breast surgery     Family History  Problem Relation Age of Onset  . Hypertension Father   . Kidney disease Father   . Cancer Maternal Aunt   . Cancer Paternal Grandmother    History  Substance Use Topics  . Smoking status: Current Every Day Smoker -- 0.50 packs/day    Types: Cigarettes  . Smokeless tobacco: Never Used  . Alcohol Use: Yes     Comment: occassionally   OB History   Grav Para Term Preterm Abortions TAB SAB Ect Mult Living   4 4        4      Review of Systems  Constitutional: Positive for fever. Negative for chills and fatigue.  HENT: Positive for sore throat. Negative for trouble  swallowing.   Eyes: Negative for visual disturbance.  Respiratory: Negative for shortness of breath.   Cardiovascular: Negative for chest pain and palpitations.  Gastrointestinal: Negative for nausea, vomiting, abdominal pain and diarrhea.  Genitourinary: Negative for dysuria and difficulty urinating.  Musculoskeletal: Positive for myalgias. Negative for arthralgias and neck pain.  Skin: Negative for color change.  Neurological: Negative for dizziness and weakness.  Psychiatric/Behavioral: Negative for dysphoric mood.      Allergies  Review of patient's allergies indicates no known allergies.  Home Medications   Current Outpatient Rx  Name  Route  Sig  Dispense  Refill  . famotidine (PEPCID) 20 MG tablet   Oral   Take 1 tablet (20 mg total) by mouth 2 (two) times daily.   30 tablet   0    BP 129/68  Pulse 101  Temp(Src) 101.2 F (38.4 C) (Oral)  Resp 16  Ht 5\' 4"  (1.626 m)  Wt 185 lb (83.915 kg)  BMI 31.74 kg/m2  SpO2 100%  LMP 12/05/2013 Physical Exam  Nursing note and vitals reviewed. Constitutional: She appears well-developed and well-nourished. No distress.  HENT:  Head: Normocephalic and atraumatic.  Mouth/Throat: Oropharyngeal exudate present.  Tonsillar edema and erythema with exudate.   Eyes: Conjunctivae and EOM are normal.  Neck: Normal range of motion.  Cardiovascular: Normal rate and regular rhythm.  Exam reveals no gallop and no friction rub.   No murmur heard. Pulmonary/Chest: Effort normal and breath sounds normal. She has no  wheezes. She has no rales. She exhibits no tenderness.  Abdominal: Soft. There is no tenderness.  Musculoskeletal: Normal range of motion.  Neurological: She is alert.  Speech is goal-oriented. Moves limbs without ataxia.   Skin: Skin is warm and dry.  Psychiatric: She has a normal mood and affect. Her behavior is normal.    ED Course  Procedures (including critical care time) Labs Review Labs Reviewed  RAPID STREP  SCREEN - Abnormal; Notable for the following:    Streptococcus, Group A Screen (Direct) POSITIVE (*)    All other components within normal limits   Imaging Review No results found.  EKG Interpretation   None       MDM   Final diagnoses:  Strep pharyngitis    6:37 PM Patient has strep throat and will be treated with Pen G. Patient is febrile and given tylenol for fever. Patient mildly tachycardic likely due to fever. Patient advised to return with worsening or concerning symptoms.     Emilia BeckKaitlyn Oluwademilade Kellett, [place] JerseyPA-C 12/19/13 1839

## 2013-12-20 NOTE — ED Provider Notes (Signed)
Medical screening examination/treatment/procedure(s) were performed by non-physician practitioner and as supervising physician I was immediately available for consultation/collaboration.  EKG Interpretation   None         Candyce ChurnJohn David Cayde Held III, MD 12/20/13 1556

## 2014-05-28 ENCOUNTER — Encounter (HOSPITAL_COMMUNITY): Payer: Self-pay | Admitting: Emergency Medicine

## 2014-05-28 ENCOUNTER — Emergency Department (HOSPITAL_COMMUNITY)
Admission: EM | Admit: 2014-05-28 | Discharge: 2014-05-28 | Disposition: A | Discharge status: Home / Self Care | Payer: Managed Care, Other (non HMO) | Attending: Emergency Medicine | Admitting: Emergency Medicine

## 2014-05-28 ENCOUNTER — Emergency Department (HOSPITAL_COMMUNITY): Payer: Managed Care, Other (non HMO)

## 2014-05-28 DIAGNOSIS — R197 Diarrhea, unspecified: Secondary | ICD-10-CM

## 2014-05-28 DIAGNOSIS — R1084 Generalized abdominal pain: Secondary | ICD-10-CM | POA: Insufficient documentation

## 2014-05-28 DIAGNOSIS — F17200 Nicotine dependence, unspecified, uncomplicated: Secondary | ICD-10-CM | POA: Insufficient documentation

## 2014-05-28 DIAGNOSIS — N289 Disorder of kidney and ureter, unspecified: Secondary | ICD-10-CM | POA: Insufficient documentation

## 2014-05-28 DIAGNOSIS — R110 Nausea: Secondary | ICD-10-CM | POA: Insufficient documentation

## 2014-05-28 DIAGNOSIS — Z9851 Tubal ligation status: Secondary | ICD-10-CM | POA: Insufficient documentation

## 2014-05-28 DIAGNOSIS — N2889 Other specified disorders of kidney and ureter: Secondary | ICD-10-CM

## 2014-05-28 LAB — COMPREHENSIVE METABOLIC PANEL WITH GFR
ALT: 14 U/L (ref 0–35)
AST: 14 U/L (ref 0–37)
Albumin: 3.6 g/dL (ref 3.5–5.2)
Alkaline Phosphatase: 19 U/L — ABNORMAL LOW (ref 39–117)
Anion gap: 11 (ref 5–15)
BUN: 9 mg/dL (ref 6–23)
CO2: 25 meq/L (ref 19–32)
Calcium: 8.5 mg/dL (ref 8.4–10.5)
Chloride: 104 meq/L (ref 96–112)
Creatinine, Ser: 0.85 mg/dL (ref 0.50–1.10)
GFR calc Af Amer: >90 mL/min
GFR calc non Af Amer: 87 mL/min — ABNORMAL LOW
Glucose, Bld: 97 mg/dL (ref 70–99)
Potassium: 4 meq/L (ref 3.7–5.3)
Sodium: 140 meq/L (ref 137–147)
Total Bilirubin: 0.3 mg/dL (ref 0.3–1.2)
Total Protein: 7.2 g/dL (ref 6.0–8.3)

## 2014-05-28 LAB — CBC WITH DIFFERENTIAL/PLATELET
BASOS ABS: 0 10*3/uL (ref 0.0–0.1)
BASOS PCT: 0 % (ref 0–1)
EOS ABS: 0.3 10*3/uL (ref 0.0–0.7)
Eosinophils Relative: 6 % — ABNORMAL HIGH (ref 0–5)
HCT: 35.6 % — ABNORMAL LOW (ref 36.0–46.0)
Hemoglobin: 11.8 g/dL — ABNORMAL LOW (ref 12.0–15.0)
Lymphocytes Relative: 44 % (ref 12–46)
Lymphs Abs: 2.1 10*3/uL (ref 0.7–4.0)
MCH: 31.8 pg (ref 26.0–34.0)
MCHC: 33.1 g/dL (ref 30.0–36.0)
MCV: 96 fL (ref 78.0–100.0)
MONOS PCT: 5 % (ref 3–12)
Monocytes Absolute: 0.2 10*3/uL (ref 0.1–1.0)
NEUTROS PCT: 45 % (ref 43–77)
Neutro Abs: 2.2 10*3/uL (ref 1.7–7.7)
PLATELETS: 256 10*3/uL (ref 150–400)
RBC: 3.71 MIL/uL — ABNORMAL LOW (ref 3.87–5.11)
RDW: 12.3 % (ref 11.5–15.5)
WBC: 4.8 10*3/uL (ref 4.0–10.5)

## 2014-05-28 MED ORDER — CIPROFLOXACIN HCL 500 MG PO TABS: 500.0000 mg | ORAL_TABLET | Freq: Two times a day (BID) | ORAL | Status: DC

## 2014-05-28 MED ORDER — METRONIDAZOLE 500 MG PO TABS: 500.0000 mg | ORAL_TABLET | Freq: Two times a day (BID) | ORAL | Status: DC

## 2014-05-28 MED ORDER — IOHEXOL 300 MG/ML  SOLN
100.0000 mL | Freq: Once | INTRAMUSCULAR | Status: AC | PRN
  Administered 2014-05-28: 100 mL via INTRAVENOUS

## 2014-05-28 MED ORDER — ONDANSETRON 4 MG PO TBDP
4.0000 mg | ORAL_TABLET | Freq: Once | ORAL | Status: AC
  Administered 2014-05-28: 4 mg via ORAL
  Filled 2014-05-28: qty 1

## 2014-05-28 MED ORDER — DICYCLOMINE HCL 10 MG PO CAPS
10.0000 mg | ORAL_CAPSULE | Freq: Once | ORAL | Status: AC
  Administered 2014-05-28: 10 mg via ORAL
  Filled 2014-05-28: qty 1

## 2014-05-28 NOTE — Discharge Instructions (Signed)
Chronic Diarrhea  Diarrhea is frequent loose and watery bowel movements. It can cause you to feel weak and dehydrated. Dehydration can cause you to become tired and thirsty and to have a dry mouth, decreased urination, and dark yellow urine. Diarrhea is a sign of another problem, most often an infection that will not last long. In most cases, diarrhea lasts 2-3 days. Diarrhea that lasts longer than 4 weeks is called long-lasting (chronic) diarrhea. It is important to treat your diarrhea as directed by your health care provider to lessen or prevent future episodes of diarrhea.   CAUSES   There are many causes of chronic diarrhea. The following are some possible causes:   · Gastrointestinal infections caused by viruses, bacteria, or parasites.    · Food poisoning or food allergies.    · Certain medicines, such as antibiotics, chemotherapy, and laxatives.    · Artificial sweeteners and fructose.    · Digestive disorders, such as celiac disease and inflammatory bowel diseases.    · Irritable bowel syndrome.  · Some disorders of the pancreas.  · Disorders of the thyroid.  · Reduced blood flow to the intestines.  · Cancer.  Sometimes the cause of chronic diarrhea is unknown.  RISK FACTORS  · Having a severely weakened immune system, such as from HIV or AIDS.    · Taking certain types of cancer-fighting drugs (such as with chemotherapy) or other medicines.    · Having had a recent organ transplant.    · Having a portion of the stomach or small bowel removed.    · Traveling to countries where food and water supplies are often contaminated.    SYMPTOMS   In addition to frequent, loose stools, diarrhea may cause:   · Cramping.    · Abdominal pain.    · Nausea.    · Fever.  · Fatigue.  · Urgent need to use the bathroom.  · Loss of bowel control.  DIAGNOSIS   Your health care provider must take a careful history and perform a physical exam. Tests given are based on your symptoms and history. Tests may include:   · Blood or  stool tests. Three or more stool samples may be examined. Stool cultures may be used to test for bacteria or parasites.    · X-rays.    · A procedure in which a thin tube is inserted into the mouth or rectum (endoscopy). This allows the health care provider to look inside the intestine.    TREATMENT   · Treatment is aimed at correcting the cause of the diarrhea when possible.  · Diarrhea caused by an infection can often be treated with antibiotic medicines.  · Diarrhea not caused by an infection may require you to take long-term medicine or have surgery. Specific treatment should be discussed with your health care provider.  · If the cause cannot be determined, treatment aims to relieve symptoms and prevent dehydration. Serious health problems can occur if you do not maintain proper fluid levels. Treatment may include:  ¨ Taking an oral rehydration solution (ORS).  ¨ Not drinking beverages that contain caffeine (such as tea, coffee, and soft drinks).  ¨ Not drinking alcohol.  ¨ Maintaining well-balanced nutrition to help you recover faster.  HOME CARE INSTRUCTIONS   · Drink enough fluids to keep urine clear or pale yellow. Drink 1 cup (8 oz) of fluid for each diarrhea episode. Avoid fluids that contain simple sugars, fruit juices, whole milk products, and sodas. Hydrate with an ORS. You may purchase the ORS or prepare it at home by mixing the   following ingredients together:  ¨  - tsp (1.7-3  mL) table salt.  ¨ ¾ tsp (3 ¾ mL) baking soda.  ¨  tsp (1.7 mL) salt substitute containing potassium chloride.  ¨ 1 tbsp (20 mL) sugar.  ¨ 4.2 c (1 L) of water.    · Certain foods and beverages may increase the speed at which food moves through the gastrointestinal (GI) tract. These foods and beverages should be avoided. They include:  ¨ Caffeinated and alcoholic beverages.  ¨ High-fiber foods, such as raw fruits and vegetables, nuts, seeds, and whole grain breads and cereals.  ¨ Foods and beverages sweetened with sugar  alcohols, such as xylitol, sorbitol, and mannitol.    · Some foods may be well tolerated and may help thicken stool. These include:  ¨ Starchy foods, such as rice, toast, pasta, low-sugar cereal, oatmeal, grits, baked potatoes, crackers, and bagels.  ¨ Bananas.  ¨ Applesauce.  · Add probiotic-rich foods to help increase healthy bacteria in the GI tract. These include yogurt and fermented milk products.  · Wash your hands well after each diarrhea episode.  · Only take over-the-counter or prescription medicines as directed by your health care provider.  · Take a warm bath to relieve any burning or pain from frequent diarrhea episodes.  SEEK MEDICAL CARE IF:   · You are not urinating as often.  · Your urine is a dark color.  · You become very tired or dizzy.  · You have severe pain in the abdomen or rectum.  · Your have blood or pus in your stools.  · Your stools look black and tarry.  SEEK IMMEDIATE MEDICAL CARE IF:   · You are unable to keep fluids down.  · You have persistent vomiting.  · You have blood in your stool.  · Your stools are black and tarry.  · You do not urinate in 6-8 hours, or there is only a small amount of very dark urine.  · You have abdominal pain that increases or localizes.  · You have weakness, dizziness, confusion, or lightheadedness.  · You have a severe headache.  · Your diarrhea gets worse or does not get better.  · You have a fever or persistent symptoms for more than 2-3 days.  · You have a fever and your symptoms suddenly get worse.  MAKE SURE YOU:   · Understand these instructions.  · Will watch your condition.  · Will get help right away if you are not doing well or get worse.  Document Released: 01/13/2004 Document Revised: 10/28/2013 Document Reviewed: 04/17/2013  [employer]® Patient Information ©2015 [employer]. This information is not intended to replace advice given to you by your health care provider. Make sure you discuss any questions you have with your health care  provider.

## 2014-05-28 NOTE — ED Notes (Signed)
Pt c/o generalized abdominal pain onset Friday. Pt seen by PMD and had blood test done, pt was told everything was normal. Pt reports continues to have pain in her abdomen with diarrhea and nausea.

## 2014-05-28 NOTE — ED Provider Notes (Signed)
CSN: [id number]     Arrival date & time 05/28/14  1032 History   First MD Initiated Contact with Patient 05/28/14 1033     Chief Complaint  Patient presents with  . Abdominal Pain     (Consider location/radiation/quality/duration/timing/severity/associated sxs/prior Treatment) HPI Pt is a 38yo female c/o generalized abdominal pain associated with nausea and watery diarrhea that started 6 days ago.  Pt states she thought she had a stomach virus but it was not improving so she f/u with her PCP on Monday, 7/20, had blood work drawn but states it was normal.  Abdominal pain is described as a discomfort, "churning" sensation, but 0/10 pain.  Cannot count how many episodes of diarrhea but states she was initially on her menstrual cycle and unsure if blood was in her stool, now "it just water."   Denies urinary or vaginal symptoms. Denies sick contacts, recent travel, or recent antibiotic use. Denies hx of similar symptoms. Denies hx of abdominal surgeries.    History reviewed. No pertinent past medical history. Past Surgical History  Procedure Laterality Date  . Breast reduction surgery    . Tubal ligation    . Breast surgery     Family History  Problem Relation Age of Onset  . Hypertension Father   . Kidney disease Father   . Cancer Maternal Aunt   . Cancer Paternal Grandmother    History  Substance Use Topics  . Smoking status: Current Every Day Smoker -- 0.50 packs/day    Types: Cigarettes  . Smokeless tobacco: Never Used  . Alcohol Use: Yes     Comment: occassionally   OB History   Grav Para Term Preterm Abortions TAB SAB Ect Mult Living   4 4        4      Review of Systems  Constitutional: Negative for chills.  Respiratory: Negative for cough and shortness of breath.   Cardiovascular: Negative for chest pain and palpitations.  Gastrointestinal: Positive for nausea, abdominal pain ("mild cramping, churning" ), diarrhea and blood in stool ("not sure, i was on my cycle").  Negative for vomiting.  Genitourinary: Negative for dysuria, urgency, frequency, hematuria and flank pain.  All other systems reviewed and are negative.     Allergies  Review of patient's allergies indicates no known allergies.  Home Medications   Prior to Admission medications   Medication Sig Start Date End Date Taking? Authorizing Provider  ciprofloxacin (CIPRO) 500 MG tablet Take 1 tablet (500 mg total) by mouth 2 (two) times daily. 05/28/14   Junius Finner, PA-C  metroNIDAZOLE (FLAGYL) 500 MG tablet Take 1 tablet (500 mg total) by mouth 2 (two) times daily. 05/28/14   Junius Finner, PA-C   BP 98/59  Pulse 65  Temp(Src) 97.9 F (36.6 C) (Oral)  Resp 16  Ht 5\' 3"  (1.6 m)  Wt 194 lb (87.998 kg)  BMI 34.37 kg/m2  SpO2 98%  LMP 05/27/2014 Physical Exam  Nursing note and vitals reviewed. Constitutional: She appears well-developed and well-nourished. No distress.  Pt appears well, non-toxic. NAD.   HENT:  Head: Normocephalic and atraumatic.  Mouth/Throat: Oropharynx is clear and moist.  Eyes: Conjunctivae are normal. No scleral icterus.  Neck: Normal range of motion.  Cardiovascular: Normal rate, regular rhythm and normal heart sounds.   Pulmonary/Chest: Effort normal and breath sounds normal. No respiratory distress. She has no wheezes. She has no rales. She exhibits no tenderness.  Abdominal: Soft. Bowel sounds are normal. She exhibits no distension and no  mass. There is no tenderness. There is no rebound and no guarding.  Soft, non-distended, non-tender.  Musculoskeletal: Normal range of motion.  Neurological: She is alert.  Skin: Skin is warm and dry. She is not diaphoretic.    ED Course  Procedures (including critical care time) Labs Review Labs Reviewed  CBC WITH DIFFERENTIAL - Abnormal; Notable for the following:    RBC 3.71 (*)    Hemoglobin 11.8 (*)    HCT 35.6 (*)    Eosinophils Relative 6 (*)    All other components within normal limits  COMPREHENSIVE  METABOLIC PANEL - Abnormal; Notable for the following:    Alkaline Phosphatase 19 (*)    GFR calc non Af Amer 87 (*)    All other components within normal limits    Imaging Review Ct Abdomen Pelvis W Contrast  05/28/2014   CLINICAL DATA:  Abdominal pain, diarrhea, nausea.  EXAM: CT ABDOMEN AND PELVIS WITH CONTRAST  TECHNIQUE: Multidetector CT imaging of the abdomen and pelvis was performed using the standard protocol following bolus administration of intravenous contrast.  CONTRAST:  OMNIPAQUE IOHEXOL 300 MG/ML  SOLN  COMPARISON:  Prior ultrasound from 10/28/2009.  FINDINGS: The visualized lung bases are clear.  The liver demonstrates a normal contrast enhanced appearance. No focal intrahepatic lesions. Gallbladder within normal limits. No biliary ductal dilatation. The spleen, adrenal glands, and pancreas demonstrate a normal contrast enhanced appearance.  A in 1.3 x 1.4 x 1.4 cm exophytic hypodense lesion seen extending from the lower pole of the left kidney (series 2, image 36). This lesion demonstrates and intermediate density of 35 Hounsfield units. Finding is indeterminate. Additional tiny subcentimeter hypodensity noted within the lower pole the left kidney, too small the characterize. Kidneys are otherwise unremarkable without evidence of nephrolithiasis, hydronephrosis, or focal enhancing renal mass.  Stomach within normal limits. No evidence of bowel obstruction. Appendix well visualized in the right lower quadrant and is of normal caliber and appearance without associated inflammatory changes to suggest acute appendicitis. No abnormal wall thickening, mucosal enhancement, or inflammatory fat stranding seen about the bowels.  Bladder within normal limits.  Uterus and ovaries are unremarkable.  Trace free fluid noted within the right pelvic cul-de-sac, likely physiologic. No free intraperitoneal air. No pathologically enlarged intra-abdominal pelvic lymph nodes.  Normal intravascular  enhancement seen throughout the intra-abdominal aorta and its branch vessels.  No acute osseus abnormality. No worrisome lytic or blastic osseous lesions.  IMPRESSION: 1. No CT evidence of acute intra-abdominal or pelvic process identified. 2. Intermediate density exophytic lesion extending from the lower pole of the left kidney. This finding is indeterminate, and may represent a cyst with internal proteinaceous material or hemorrhage, although a possible underlying renal malignancy could also have this appearance. Further evaluation with renal mass protocol MRI is recommended. This could be performed on a nonemergent basis.   Electronically Signed   By: Rise Mu M.D.   On: 05/28/2014 13:49     EKG Interpretation None      MDM   Final diagnoses:  Diarrhea  Left kidney mass    Pt c/o 6b day long persistent watery diarrhea with abdominal discomfort. Denies fever or vomiting. No known sick contacts or recent travel. No recent antibiotic use.  Pt appears well, non-toxic. Moist mucous membranes. Abdominal exam: soft, non-distended, non-tender.  Low suspicion for surgical abdomen or other emergent process at this time. Discussed pt with Dr. Judd Lien, pt requested CT abd to ensure no specific underlying cause of diarrhea.  CT abd: no CT evidence of acute intra-abdominal or pelvic process identified.  Intermediate density exophytic lesion extending from lower pole of left kidney.  Recommended non-emergent renal mass protocol MRI.   Pt has had no vomiting in ED.  States feeling a little better after given zofran and bentyl.  Will tx for possible intraabdominal infectious process causing persistent diarrhea, Cipro and Flagyl x 7 days.  Pt may be discharged home. Advised she may take OTC imodium to help with diarrhea. Advised to f/u with PCP next week for recheck of symptoms. Out patient MRI for further evaluation of renal mass ordered. Return precautions provided. Pt verbalized understanding and  agreement with tx plan.     Junius Finnerrin O'Malley, PA-C 05/28/14 1456

## 2014-05-28 NOTE — ED Notes (Signed)
CT notified pt completed oral contrast.

## 2014-05-28 NOTE — ED Notes (Signed)
Pt alert, oriented and stable at discharge.  Pt ambulatory to the waiting room, escorted by this RN.  Pt advised of medications and follow up appointments and verbalized understanding and the need for follow up.

## 2014-05-29 NOTE — ED Provider Notes (Signed)
Medical screening examination/treatment/procedure(s) were performed by non-physician practitioner and as supervising physician I was immediately available for consultation/collaboration.     Dayshon Roback, MD 05/29/14 1501 

## 2014-06-04 ENCOUNTER — Ambulatory Visit (HOSPITAL_COMMUNITY)
Admit: 2014-06-04 | Discharge: 2014-06-04 | Disposition: A | Discharge status: Home / Self Care | Payer: Managed Care, Other (non HMO) | Source: Ambulatory Visit | Attending: Emergency Medicine | Admitting: Emergency Medicine

## 2014-06-04 DIAGNOSIS — N2889 Other specified disorders of kidney and ureter: Secondary | ICD-10-CM

## 2014-06-04 DIAGNOSIS — Q619 Cystic kidney disease, unspecified: Secondary | ICD-10-CM | POA: Insufficient documentation

## 2014-06-04 MED ORDER — GADOBENATE DIMEGLUMINE 529 MG/ML IV SOLN
20.0000 mL | Freq: Once | INTRAVENOUS | Status: AC
  Administered 2014-06-04: 20 mL via INTRAVENOUS

## 2014-09-07 ENCOUNTER — Encounter (HOSPITAL_COMMUNITY): Payer: Self-pay | Admitting: Emergency Medicine

## 2015-02-18 ENCOUNTER — Ambulatory Visit (INDEPENDENT_AMBULATORY_CARE_PROVIDER_SITE_OTHER): Payer: 59 | Admitting: Women's Health

## 2015-02-18 ENCOUNTER — Encounter: Payer: Self-pay | Admitting: Women's Health

## 2015-02-18 VITALS — BP 119/82 | Ht 63.0 in | Wt 202.4 lb

## 2015-02-18 DIAGNOSIS — E669 Obesity, unspecified: Secondary | ICD-10-CM

## 2015-02-18 DIAGNOSIS — N898 Other specified noninflammatory disorders of vagina: Secondary | ICD-10-CM

## 2015-02-18 DIAGNOSIS — Z01419 Encounter for gynecological examination (general) (routine) without abnormal findings: Secondary | ICD-10-CM

## 2015-02-18 LAB — WET PREP FOR TRICH, YEAST, CLUE
Clue Cells Wet Prep HPF POC: NONE SEEN
Trich, Wet Prep: NONE SEEN
WBC WET PREP: NONE SEEN
YEAST WET PREP: NONE SEEN

## 2015-02-18 NOTE — Progress Notes (Signed)
Sharon Bryant Sharon Bryant [birth year] [id number]    History:    Presents for annual exam. Monthly cycle, 4 days/BTL, same partner, LMP 02/03/15. Abnormal Pap >10 years ago, required cryo, normal Paps since. Concern about decreasing libido and pain with intercourse from dryness x 2 years. Sister with menopause onset at age 37. Also complains of yellow vaginal discharge with fishy odor, worse after intercourse x 2-3 weeks. Tried Vagisil pH with mild relief. Denies urinary symptoms, hematuria, fever, or abdominal pain. Smokes 1/2 pack per day, interested in quitting, tried Chantix in past but disliked it. Weight gain 15#. Reports normal labs at primary care several weeks ago.  Past medical history, past surgical history, family history and social history were all reviewed and documented in the EPIC chart. Science writerAssistant manager of convenience store, recently married, 4 sons - ages 8318, 6517, 3013, and 716. Mother thyroid problems. Father diabetes, HTN, and kidney disease.  ROS:  A ROS was performed and pertinent positives and negatives are included.  Exam:  Filed Vitals:   02/18/15 1127  BP: 119/82    General appearance:  Normal Thyroid:  Symmetrical, normal in size, without palpable masses or nodularity. Respiratory  Auscultation:  Clear without wheezing or rhonchi Cardiovascular  Auscultation:  Regular rate, without rubs, murmurs or gallops  Edema/varicosities:  Not grossly evident Abdominal  Soft,nontender, without masses, guarding or rebound.  Liver/spleen:  No organomegaly noted  Hernia:  None appreciated  Skin  Inspection:  Grossly normal   Breasts: Examined lying and sitting. S/p bilateral breast reduction    Right: Without masses, retractions, discharge or axillary adenopathy.    Left: Without masses, retractions, discharge or axillary adenopathy. Gentitourinary   Inguinal/mons:  Normal without inguinal adenopathy  External genitalia:  Normal  BUS/Urethra/Skene's glands:  Normal  Vagina:   Normal wet prep negative  Cervix:  Normal  Uterus:  Normal in size, shape and contour.  Midline and mobile  Adnexa/parametria:     Rt: Without masses or tenderness.   Lt: Without masses or tenderness.  Anus and perineum: Normal  Digital rectal exam: Normal sphincter tone without palpated masses or tenderness  Assessment/Plan:  39 y.o. MBF G4P4 for annual exam with complaints of decrease in libido, dryness and vaginal discharge.  Monthly cycle/BTL Abnormal Pap >10 years ago, cryo, normal Paps since Decrease in libido Normal vaginal discharge Smoker - 1/2 pack per day, trying to quit Obesity  Plan: SBEs, Vit D 1000 daily, calcium rich diet, decrease calories and increase regular exercise encouraged. Discussed risks of smoking and encouraged to continue with efforts to quit. Normal Pap 2015, new screening guidelines reviewed. Recommended taking time with husband to improve libido, over-the-counter vaginal lubricants. Reassurance given regarding vaginal discharge, follow-up if symptoms persist or worsen. Primary care manages labs, last drawn about a month ago, normal per patient.  Harrington ChallengerYOUNG,Dajon Rowe J [facility] Medical CenterWHNP, 12:23 PM 02/18/2015

## 2015-02-18 NOTE — Patient Instructions (Signed)
Health Maintenance Adopting a healthy lifestyle and getting preventive care can go a long way to promote health and wellness. Talk with your health care provider about what schedule of regular examinations is right for you. This is a good chance for you to check in with your provider about disease prevention and staying healthy. In between checkups, there are plenty of things you can do on your own. Experts have done a lot of research about which lifestyle changes and preventive measures are most likely to keep you healthy. Ask your health care provider for more information. WEIGHT AND DIET  Eat a healthy diet  Be sure to include plenty of vegetables, fruits, low-fat dairy products, and lean protein.  Do not eat a lot of foods high in solid fats, added sugars, or salt.  Get regular exercise. This is one of the most important things you can do for your health.  Most adults should exercise for at least 150 minutes each week. The exercise should increase your heart rate and make you sweat (moderate-intensity exercise).  Most adults should also do strengthening exercises at least twice a week. This is in addition to the moderate-intensity exercise.  Maintain a healthy weight  Body mass index (BMI) is a measurement that can be used to identify possible weight problems. It estimates body fat based on height and weight. Your health care provider can help determine your BMI and help you achieve or maintain a healthy weight.  For females 25 years of age and older:   A BMI below 18.5 is considered underweight.  A BMI of 18.5 to 24.9 is normal.  A BMI of 25 to 29.9 is considered overweight.  A BMI of 30 and above is considered obese.  Watch levels of cholesterol and blood lipids  You should start having your blood tested for lipids and cholesterol at 37 years of age, then have this test every 5 years.  You may need to have your cholesterol levels checked more often if:  Your lipid or  cholesterol levels are high.  You are older than 37 years of age.  You are at high risk for heart disease.  CANCER SCREENING   Lung Cancer  Lung cancer screening is recommended for adults 97-92 years old who are at high risk for lung cancer because of a history of smoking.  A yearly low-dose CT scan of the lungs is recommended for people who:  Currently smoke.  Have quit within the past 15 years.  Have at least a 30-pack-year history of smoking. A pack year is smoking an average of one pack of cigarettes a day for 1 year.  Yearly screening should continue until it has been 15 years since you quit.  Yearly screening should stop if you develop a health problem that would prevent you from having lung cancer treatment.  Breast Cancer  Practice breast self-awareness. This means understanding how your breasts normally appear and feel.  It also means doing regular breast self-exams. Let your health care provider know about any changes, no matter how small.  If you are in your 20s or 30s, you should have a clinical breast exam (CBE) by a health care provider every 1-3 years as part of a regular health exam.  If you are 76 or older, have a CBE every year. Also consider having a breast X-ray (mammogram) every year.  If you have a family history of breast cancer, talk to your health care provider about genetic screening.  If you are  at high risk for breast cancer, talk to your health care provider about having an MRI and a mammogram every year.  Breast cancer gene (BRCA) assessment is recommended for women who have family members with BRCA-related cancers. BRCA-related cancers include:  Breast.  Ovarian.  Tubal.  Peritoneal cancers.  Results of the assessment will determine the need for genetic counseling and BRCA1 and BRCA2 testing. Cervical Cancer Routine pelvic examinations to screen for cervical cancer are no longer recommended for nonpregnant women who are considered low  risk for cancer of the pelvic organs (ovaries, uterus, and vagina) and who do not have symptoms. A pelvic examination may be necessary if you have symptoms including those associated with pelvic infections. Ask your health care provider if a screening pelvic exam is right for you.   The Pap test is the screening test for cervical cancer for women who are considered at risk.  If you had a hysterectomy for a problem that was not cancer or a condition that could lead to cancer, then you no longer need Pap tests.  If you are older than 65 years, and you have had normal Pap tests for the past 10 years, you no longer need to have Pap tests.  If you have had past treatment for cervical cancer or a condition that could lead to cancer, you need Pap tests and screening for cancer for at least 20 years after your treatment.  If you no longer get a Pap test, assess your risk factors if they change (such as having a new sexual partner). This can affect whether you should start being screened again.  Some women have medical problems that increase their chance of getting cervical cancer. If this is the case for you, your health care provider may recommend more frequent screening and Pap tests.  The human papillomavirus (HPV) test is another test that may be used for cervical cancer screening. The HPV test looks for the virus that can cause cell changes in the cervix. The cells collected during the Pap test can be tested for HPV.  The HPV test can be used to screen women 30 years of age and older. Getting tested for HPV can extend the interval between normal Pap tests from three to five years.  An HPV test also should be used to screen women of any age who have unclear Pap test results.  After 37 years of age, women should have HPV testing as often as Pap tests.  Colorectal Cancer  This type of cancer can be detected and often prevented.  Routine colorectal cancer screening usually begins at 37 years of  age and continues through 37 years of age.  Your health care provider may recommend screening at an earlier age if you have risk factors for colon cancer.  Your health care provider may also recommend using home test kits to check for hidden blood in the stool.  A small camera at the end of a tube can be used to examine your colon directly (sigmoidoscopy or colonoscopy). This is done to check for the earliest forms of colorectal cancer.  Routine screening usually begins at age 50.  Direct examination of the colon should be repeated every 5-10 years through 37 years of age. However, you may need to be screened more often if early forms of precancerous polyps or small growths are found. Skin Cancer  Check your skin from head to toe regularly.  Tell your health care provider about any new moles or changes in   moles, especially if there is a change in a mole's shape or color.  Also tell your health care provider if you have a mole that is larger than the size of a pencil eraser.  Always use sunscreen. Apply sunscreen liberally and repeatedly throughout the day.  Protect yourself by wearing long sleeves, pants, a wide-brimmed hat, and sunglasses whenever you are outside. HEART DISEASE, DIABETES, AND HIGH BLOOD PRESSURE   Have your blood pressure checked at least every 1-2 years. High blood pressure causes heart disease and increases the risk of stroke.  If you are between 75 years and 42 years old, ask your health care provider if you should take aspirin to prevent strokes.  Have regular diabetes screenings. This involves taking a blood sample to check your fasting blood sugar level.  If you are at a normal weight and have a low risk for diabetes, have this test once every three years after 37 years of age.  If you are overweight and have a high risk for diabetes, consider being tested at a younger age or more often. PREVENTING INFECTION  Hepatitis B  If you have a higher risk for  hepatitis B, you should be screened for this virus. You are considered at high risk for hepatitis B if:  You were born in a country where hepatitis B is common. Ask your health care provider which countries are considered high risk.  Your parents were born in a high-risk country, and you have not been immunized against hepatitis B (hepatitis B vaccine).  You have HIV or AIDS.  You use needles to inject street drugs.  You live with someone who has hepatitis B.  You have had sex with someone who has hepatitis B.  You get hemodialysis treatment.  You take certain medicines for conditions, including cancer, organ transplantation, and autoimmune conditions. Hepatitis C  Blood testing is recommended for:  Everyone born from [birth year] through 1963.  Anyone with known risk factors for hepatitis C. Sexually transmitted infections (STIs)  You should be screened for sexually transmitted infections (STIs) including gonorrhea and chlamydia if:  You are sexually active and are younger than 37 years of age.  You are older than 37 years of age and your health care provider tells you that you are at risk for this type of infection.  Your sexual activity has changed since you were last screened and you are at an increased risk for chlamydia or gonorrhea. Ask your health care provider if you are at risk.  If you do not have HIV, but are at risk, it may be recommended that you take a prescription medicine daily to prevent HIV infection. This is called pre-exposure prophylaxis (PrEP). You are considered at risk if:  You are sexually active and do not regularly use condoms or know the HIV status of your partner(s).  You take drugs by injection.  You are sexually active with a partner who has HIV. Talk with your health care provider about whether you are at high risk of being infected with HIV. If you choose to begin PrEP, you should first be tested for HIV. You should then be tested every 3 months for  as long as you are taking PrEP.  PREGNANCY   If you are premenopausal and you may become pregnant, ask your health care provider about preconception counseling.  If you may become pregnant, take 400 to 800 micrograms (mcg) of folic acid every day.  If you want to prevent pregnancy, talk to your  health care provider about birth control (contraception). OSTEOPOROSIS AND MENOPAUSE   Osteoporosis is a disease in which the bones lose minerals and strength with aging. This can result in serious bone fractures. Your risk for osteoporosis can be identified using a bone density scan.  If you are 65 years of age or older, or if you are at risk for osteoporosis and fractures, ask your health care provider if you should be screened.  Ask your health care provider whether you should take a calcium or vitamin D supplement to lower your risk for osteoporosis.  Menopause may have certain physical symptoms and risks.  Hormone replacement therapy may reduce some of these symptoms and risks. Talk to your health care provider about whether hormone replacement therapy is right for you.  HOME CARE INSTRUCTIONS   Schedule regular health, dental, and eye exams.  Stay current with your immunizations.   Do not use any tobacco products including cigarettes, chewing tobacco, or electronic cigarettes.  If you are pregnant, do not drink alcohol.  If you are breastfeeding, limit how much and how often you drink alcohol.  Limit alcohol intake to no more than 1 drink per day for nonpregnant women. One drink equals 12 ounces of beer, 5 ounces of wine, or 1 ounces of hard liquor.  Do not use street drugs.  Do not share needles.  Ask your health care provider for help if you need support or information about quitting drugs.  Tell your health care provider if you often feel depressed.  Tell your health care provider if you have ever been abused or do not feel safe at home. Document Released: 05/08/2011  Document Revised: 03/09/2014 Document Reviewed: 09/24/2013 Exit[employer] Patient Information 2015 [employer]. This information is not intended to replace advice given to you by your health care provider. Make sure you discuss any questions you have with your health care provider. Exercise to Stay Healthy Exercise helps you become and stay healthy. EXERCISE IDEAS AND TIPS Choose exercises that:  You enjoy.  Fit into your day. You do not need to exercise really hard to be healthy. You can do exercises at a slow or medium level and stay healthy. You can:  Stretch before and after working out.  Try yoga, Pilates, or tai chi.  Lift weights.  Walk fast, swim, jog, run, climb stairs, bicycle, dance, or rollerskate.  Take aerobic classes. Exercises that burn about 150 calories:  Running 1  miles in 15 minutes.  Playing volleyball for 45 to 60 minutes.  Washing and waxing a car for 45 to 60 minutes.  Playing touch football for 45 minutes.  Walking 1  miles in 35 minutes.  Pushing a stroller 1  miles in 30 minutes.  Playing basketball for 30 minutes.  Raking leaves for 30 minutes.  Bicycling 5 miles in 30 minutes.  Walking 2 miles in 30 minutes.  Dancing for 30 minutes.  Shoveling snow for 15 minutes.  Swimming laps for 20 minutes.  Walking up stairs for 15 minutes.  Bicycling 4 miles in 15 minutes.  Gardening for 30 to 45 minutes.  Jumping rope for 15 minutes.  Washing windows or floors for 45 to 60 minutes. Document Released: 11/25/2010 Document Revised: 01/15/2012 Document Reviewed: 11/25/2010 Exit[employer] Patient Information 2015 [employer]. This information is not intended to replace advice given to you by your health care provider. Make sure you discuss any questions you have with your health care provider.  

## 2015-02-22 ENCOUNTER — Telehealth: Payer: Self-pay

## 2015-02-22 NOTE — Telephone Encounter (Signed)
Saw [facility] for [facility] last week.  Had regular menses at beginning of month.  Saturday 4/16 began with bleeding again. Has never had two periods in one month. (Per your note has had BTL.)  What to rec?

## 2015-02-22 NOTE — Telephone Encounter (Signed)
Patient advised.

## 2015-02-22 NOTE — Telephone Encounter (Signed)
Watch for now, if has cycles < 21 days from day one to day one again, will check labs.  One time thing not too worrisome,  Her LMP was 3/30

## 2016-01-14 ENCOUNTER — Emergency Department (HOSPITAL_COMMUNITY): Payer: 59

## 2016-01-14 ENCOUNTER — Encounter (HOSPITAL_COMMUNITY): Payer: Self-pay | Admitting: Emergency Medicine

## 2016-01-14 ENCOUNTER — Emergency Department (HOSPITAL_COMMUNITY)
Admission: EM | Admit: 2016-01-14 | Discharge: 2016-01-14 | Disposition: A | Discharge status: Home / Self Care | Payer: 59 | Attending: Emergency Medicine | Admitting: Emergency Medicine

## 2016-01-14 DIAGNOSIS — F17210 Nicotine dependence, cigarettes, uncomplicated: Secondary | ICD-10-CM | POA: Diagnosis not present

## 2016-01-14 DIAGNOSIS — R002 Palpitations: Secondary | ICD-10-CM | POA: Diagnosis not present

## 2016-01-14 DIAGNOSIS — R079 Chest pain, unspecified: Secondary | ICD-10-CM | POA: Diagnosis present

## 2016-01-14 LAB — I-STAT TROPONIN, ED: Troponin i, poc: 0 ng/mL (ref 0.00–0.08)

## 2016-01-14 LAB — CBC
HCT: 36.3 % (ref 36.0–46.0)
HEMOGLOBIN: 11.9 g/dL — AB (ref 12.0–15.0)
MCH: 31.2 pg (ref 26.0–34.0)
MCHC: 32.8 g/dL (ref 30.0–36.0)
MCV: 95 fL (ref 78.0–100.0)
Platelets: 268 10*3/uL (ref 150–400)
RBC: 3.82 MIL/uL — ABNORMAL LOW (ref 3.87–5.11)
RDW: 11.9 % (ref 11.5–15.5)
WBC: 6.8 10*3/uL (ref 4.0–10.5)

## 2016-01-14 LAB — BASIC METABOLIC PANEL
ANION GAP: 12 (ref 5–15)
BUN: 9 mg/dL (ref 6–20)
CALCIUM: 9.2 mg/dL (ref 8.9–10.3)
CO2: 23 mmol/L (ref 22–32)
Chloride: 102 mmol/L (ref 101–111)
Creatinine, Ser: 1.03 mg/dL — ABNORMAL HIGH (ref 0.44–1.00)
GFR calc Af Amer: >60 mL/min (ref >60)
GLUCOSE: 104 mg/dL — AB (ref 65–99)
Potassium: 4 mmol/L (ref 3.5–5.1)
Sodium: 137 mmol/L (ref 135–145)

## 2016-01-14 NOTE — ED Notes (Signed)
Pt c/o chest pressure and palpitations that started tonight at 8pm.

## 2016-01-14 NOTE — ED Provider Notes (Signed)
CSN: [id number]     Arrival date & time 01/14/16  0101 History   First MD Initiated Contact with Patient 01/14/16 0522     Chief Complaint  Patient presents with  . Chest Pain     (Consider location/radiation/quality/duration/timing/severity/associated sxs/prior Treatment) HPI Comments: Patient presents to the emergency department for evaluation of palpitations. Patient reports sudden onset of palpitations while at work 8 PM. She could not count her pulse, is not sure how fast it was going. At the time that it was occurring, it was irregular and felt like it was pounding. There was a pressure associated with the symptoms in her chest. She felt short of breath when it was occurring. Symptoms started suddenly and have since resolved. No recurrence while here in the ER. Patient denies any history of heart disease or arrhythmia.  Patient is a 40 y.o. female presenting with chest pain.  Chest Pain Associated symptoms: palpitations     History reviewed. No pertinent past medical history. Past Surgical History  Procedure Laterality Date  . Breast reduction surgery    . Tubal ligation    . Breast surgery     Family History  Problem Relation Age of Onset  . Hypertension Father   . Kidney disease Father   . Cancer Maternal Aunt   . Cancer Paternal Grandmother    Social History  Substance Use Topics  . Smoking status: Current Every Day Smoker -- 0.50 packs/day    Types: Cigarettes  . Smokeless tobacco: Never Used  . Alcohol Use: Yes     Comment: occassionally   OB History    Gravida Para Term Preterm AB TAB SAB Ectopic Multiple Living   4 4        4      Review of Systems  Cardiovascular: Positive for chest pain and palpitations.  All other systems reviewed and are negative.     Allergies  Review of patient's allergies indicates no known allergies.  Home Medications   Prior to Admission medications   Medication Sig Start Date End Date Taking? Authorizing Provider   ciprofloxacin (CIPRO) 500 MG tablet Take 1 tablet (500 mg total) by mouth 2 (two) times daily. Patient not taking: Reported on 02/18/2015 05/28/14   Junius FinnerErin O'Malley, PA-C  metroNIDAZOLE (FLAGYL) 500 MG tablet Take 1 tablet (500 mg total) by mouth 2 (two) times daily. Patient not taking: Reported on 02/18/2015 05/28/14   Junius FinnerErin O'Malley, PA-C   BP 126/71 mmHg  Pulse 85  Temp(Src) 97.5 F (36.4 C) (Oral)  Resp 18  SpO2 99%  LMP 01/06/2016 Physical Exam  Constitutional: She is oriented to person, place, and time. She appears well-developed and well-nourished. No distress.  HENT:  Head: Normocephalic and atraumatic.  Right Ear: Hearing normal.  Left Ear: Hearing normal.  Nose: Nose normal.  Mouth/Throat: Oropharynx is clear and moist and mucous membranes are normal.  Eyes: Conjunctivae and EOM are normal. Pupils are equal, round, and reactive to light.  Neck: Normal range of motion. Neck supple.  Cardiovascular: Regular rhythm, S1 normal and S2 normal.  Exam reveals no gallop and no friction rub.   No murmur heard. Pulmonary/Chest: Effort normal and breath sounds normal. No respiratory distress. She exhibits no tenderness.  Abdominal: Soft. Normal appearance and bowel sounds are normal. There is no hepatosplenomegaly. There is no tenderness. There is no rebound, no guarding, no tenderness at McBurney's point and negative Murphy's sign. No hernia.  Musculoskeletal: Normal range of motion.  Neurological: She is alert and  oriented to person, place, and time. She has normal strength. No cranial nerve deficit or sensory deficit. Coordination normal. GCS eye subscore is 4. GCS verbal subscore is 5. GCS motor subscore is 6.  Skin: Skin is warm, dry and intact. No rash noted. No cyanosis.  Psychiatric: She has a normal mood and affect. Her speech is normal and behavior is normal. Thought content normal.  Nursing note and vitals reviewed.   ED Course  Procedures (including critical care time) Labs  Review Labs Reviewed  BASIC METABOLIC PANEL - Abnormal; Notable for the following:    Glucose, Bld 104 (*)    Creatinine, Ser 1.03 (*)    All other components within normal limits  CBC - Abnormal; Notable for the following:    RBC 3.82 (*)    Hemoglobin 11.9 (*)    All other components within normal limits  I-STAT TROPOININ, ED    Imaging Review Dg Chest 2 View  01/14/2016  CLINICAL DATA:  Substernal chest pain for 5 hours. EXAM: CHEST  2 VIEW COMPARISON:  None. FINDINGS: The cardiomediastinal contours are normal. The lungs are clear. Pulmonary vasculature is normal. No consolidation, pleural effusion, or pneumothorax. No acute osseous abnormalities are seen. IMPRESSION: No acute pulmonary process. Electronically Signed   By: Rubye Oaks M.D.   On: 01/14/2016 01:54   I have personally reviewed and evaluated these images and lab results as part of my medical decision-making.   EKG Interpretation   Date/Time:  Friday January 14 2016 01:08:00 EST Ventricular Rate:  91 PR Interval:  170 QRS Duration: 82 QT Interval:  366 QTC Calculation: 450 R Axis:   23 Text Interpretation:  Normal sinus rhythm Normal ECG Confirmed by Noya Santarelli   MD, Timoty Bourke ([id number]) on 01/14/2016 5:30:59 AM      MDM   Final diagnoses:  None  Heart palpitations  Patient experiencing very atypical chest discomfort symptoms. She primarily is complaining of palpitations but there was shortness of breath and pressure associated with the symptoms. Symptoms started suddenly, occurred for a while while at work and then spontaneously resolved. There has not been any recurrence. No arrhythmia noted here in the ER. Patient's vital signs are normal. Workup was normal including a normal EKG and normal troponin. Symptoms most consistent with PACs or PVCs, although not caught on the monitor. Will therefore refer to cardiology for further evaluation. Patient reports that she has had her thyroid checked every 6 months because  of family history, has never had abnormal values. Patient counseled to return to the ER if she has any recurrent symptoms so we can place her on monitor.    Gilda Crease, MD 01/14/16 [phone]

## 2016-01-14 NOTE — Discharge Instructions (Signed)

## 2016-01-18 ENCOUNTER — Emergency Department (HOSPITAL_COMMUNITY): Payer: 59

## 2016-01-18 ENCOUNTER — Other Ambulatory Visit: Payer: Self-pay

## 2016-01-18 ENCOUNTER — Encounter (HOSPITAL_COMMUNITY): Payer: Self-pay | Admitting: *Deleted

## 2016-01-18 ENCOUNTER — Emergency Department (HOSPITAL_COMMUNITY)
Admission: EM | Admit: 2016-01-18 | Discharge: 2016-01-18 | Disposition: A | Discharge status: Home / Self Care | Payer: 59 | Attending: Emergency Medicine | Admitting: Emergency Medicine

## 2016-01-18 DIAGNOSIS — R002 Palpitations: Secondary | ICD-10-CM | POA: Insufficient documentation

## 2016-01-18 DIAGNOSIS — J069 Acute upper respiratory infection, unspecified: Secondary | ICD-10-CM | POA: Diagnosis not present

## 2016-01-18 DIAGNOSIS — R5383 Other fatigue: Secondary | ICD-10-CM | POA: Diagnosis not present

## 2016-01-18 DIAGNOSIS — R079 Chest pain, unspecified: Secondary | ICD-10-CM | POA: Diagnosis present

## 2016-01-18 DIAGNOSIS — F17210 Nicotine dependence, cigarettes, uncomplicated: Secondary | ICD-10-CM | POA: Diagnosis not present

## 2016-01-18 DIAGNOSIS — R42 Dizziness and giddiness: Secondary | ICD-10-CM | POA: Insufficient documentation

## 2016-01-18 LAB — CBC
HCT: 37.5 % (ref 36.0–46.0)
HEMOGLOBIN: 12.1 g/dL (ref 12.0–15.0)
MCH: 30.9 pg (ref 26.0–34.0)
MCHC: 32.3 g/dL (ref 30.0–36.0)
MCV: 95.7 fL (ref 78.0–100.0)
PLATELETS: 238 10*3/uL (ref 150–400)
RBC: 3.92 MIL/uL (ref 3.87–5.11)
RDW: 12 % (ref 11.5–15.5)
WBC: 4.1 10*3/uL (ref 4.0–10.5)

## 2016-01-18 LAB — BASIC METABOLIC PANEL
Anion gap: 12 (ref 5–15)
BUN: 6 mg/dL (ref 6–20)
CHLORIDE: 103 mmol/L (ref 101–111)
CO2: 22 mmol/L (ref 22–32)
Calcium: 9.3 mg/dL (ref 8.9–10.3)
Creatinine, Ser: 0.93 mg/dL (ref 0.44–1.00)
GFR calc non Af Amer: >60 mL/min (ref >60)
Glucose, Bld: 100 mg/dL — ABNORMAL HIGH (ref 65–99)
POTASSIUM: 4.2 mmol/L (ref 3.5–5.1)
SODIUM: 137 mmol/L (ref 135–145)

## 2016-01-18 LAB — I-STAT TROPONIN, ED: Troponin i, poc: 0 ng/mL (ref 0.00–0.08)

## 2016-01-18 MED ORDER — BENZONATATE 100 MG PO CAPS: 100.0000 mg | ORAL_CAPSULE | Freq: Three times a day (TID) | ORAL | Status: DC

## 2016-01-18 MED ORDER — ACETAMINOPHEN 325 MG PO TABS
650.0000 mg | ORAL_TABLET | Freq: Once | ORAL | Status: AC
  Administered 2016-01-18: 650 mg via ORAL

## 2016-01-18 MED ORDER — ACETAMINOPHEN 325 MG PO TABS
ORAL_TABLET | ORAL | Status: AC
  Filled 2016-01-18: qty 2

## 2016-01-18 NOTE — Discharge Instructions (Signed)

## 2016-01-18 NOTE — ED Provider Notes (Signed)
CSN: [id number]     Arrival date & time 01/18/16  1001 History   First MD Initiated Contact with Patient 01/18/16 1604     Chief Complaint  Patient presents with  . Chest Pain     (Consider location/radiation/quality/duration/timing/severity/associated sxs/prior Treatment) HPI Comments: Patient is a 40 year old female with no significant past medical history presents with cough. She states that she has a cough that started last night. She's had associated rhinorrhea. Her cough is dry, nonproductive. She denies any known fevers. She denies any wheezing but she does feel little short of breath, mostly with coughing but sometimes not with coughing. This just started last night with the coughing as well. She has pain to the center of her chest that only occurs with coughing. She was seen here about a week ago for palpitations and atypical chest pain. She states this is totally different and the pain is sharp. She states the pain only comes with the coughing. She feels a little lightheaded at times. She denies any leg pain or swelling. No nausea or vomiting. No recent immobilizations. She's not on exogenous estrogens. She is a smoker.  Patient is a 40 y.o. female presenting with chest pain.  Chest Pain Associated symptoms: cough and fatigue   Associated symptoms: no abdominal pain, no back pain, no diaphoresis, no dizziness, no fever, no headache, no nausea, no numbness, no shortness of breath, not vomiting and no weakness     History reviewed. No pertinent past medical history. Past Surgical History  Procedure Laterality Date  . Breast reduction surgery    . Tubal ligation    . Breast surgery     Family History  Problem Relation Age of Onset  . Hypertension Father   . Kidney disease Father   . Cancer Maternal Aunt   . Cancer Paternal Grandmother    Social History  Substance Use Topics  . Smoking status: Current Every Day Smoker -- 0.50 packs/day    Types: Cigarettes  . Smokeless  tobacco: Never Used  . Alcohol Use: Yes     Comment: occassionally   OB History    Gravida Para Term Preterm AB TAB SAB Ectopic Multiple Living   Review of Systems  Constitutional: Positive for fatigue. Negative for fever, chills and diaphoresis.  HENT: Positive for congestion and rhinorrhea. Negative for sneezing.   Eyes: Negative.   Respiratory: Positive for cough. Negative for chest tightness and shortness of breath.   Cardiovascular: Negative for chest pain and leg swelling.  Gastrointestinal: Negative for nausea, vomiting, abdominal pain, diarrhea and blood in stool.  Genitourinary: Negative for frequency, hematuria, flank pain and difficulty urinating.  Musculoskeletal: Negative for back pain and arthralgias.  Skin: Negative for rash.  Neurological: Positive for light-headedness. Negative for dizziness, speech difficulty, weakness, numbness and headaches.      Allergies  Review of patient's allergies indicates no known allergies.  Home Medications   Prior to Admission medications   Medication Sig Start Date End Date Taking? Authorizing Provider  benzonatate (TESSALON) 100 MG capsule Take 1 capsule (100 mg total) by mouth every 8 (eight) hours. 01/18/16   Rolan Bucco, MD  ciprofloxacin (CIPRO) 500 MG tablet Take 1 tablet (500 mg total) by mouth 2 (two) times daily. Patient not taking: Reported on 02/18/2015 05/28/14   Junius Finner, PA-C  metroNIDAZOLE (FLAGYL) 500 MG tablet Take 1 tablet (500 mg total) by mouth 2 (two) times daily.  Patient not taking: Reported on 02/18/2015 05/28/14   Junius FinnerErin O'Malley, PA-C   BP 109/74 mmHg  Pulse 88  Temp(Src) 98.2 F (36.8 C) (Oral)  Resp 16  Ht 5\' 4"  (1.626 m)  SpO2 98%  LMP 01/06/2016 Physical Exam  Constitutional: She is oriented to person, place, and time. She appears well-developed and well-nourished.  HENT:  Head: Normocephalic and atraumatic.  Right Ear: External ear normal.  Left Ear: External ear normal.   Mouth/Throat: Oropharynx is clear and moist.  Eyes: Pupils are equal, round, and reactive to light.  Neck: Normal range of motion. Neck supple.  Cardiovascular: Normal rate, regular rhythm and normal heart sounds.   Pulmonary/Chest: Effort normal and breath sounds normal. No respiratory distress. She has no wheezes. She has no rales. She exhibits no tenderness.  Abdominal: Soft. Bowel sounds are normal. There is no tenderness. There is no rebound and no guarding.  Musculoskeletal: Normal range of motion. She exhibits no edema.  Lymphadenopathy:    She has no cervical adenopathy.  Neurological: She is alert and oriented to person, place, and time.  Skin: Skin is warm and dry. No rash noted.  Psychiatric: She has a normal mood and affect.    ED Course  Procedures (including critical care time) Labs Review Labs Reviewed  BASIC METABOLIC PANEL - Abnormal; Notable for the following:    Glucose, Bld 100 (*)    All other components within normal limits  CBC  I-STAT TROPOININ, ED    Imaging Review Dg Chest 2 View  01/18/2016  CLINICAL DATA:  Chest pain and shortness of breath since last evening. EXAM: CHEST  2 VIEW COMPARISON:  01/14/2016 FINDINGS: The cardiac silhouette, mediastinal and hilar contours are within normal limits and stable. The lungs are clear of acute process. No definite infiltrates, edema or effusions. The bony structures are intact. IMPRESSION: No acute cardiopulmonary findings. Electronically Signed   By: Rudie MeyerP.  Gallerani M.D.   On: 01/18/2016 10:35   I have personally reviewed and evaluated these images and lab results as part of my medical decision-making.   EKG Interpretation   Date/Time:  Tuesday January 18 2016 16:20:03 EDT Ventricular Rate:  78 PR Interval:  163 QRS Duration: 93 QT Interval:  371 QTC Calculation: 423 R Axis:   54 Text Interpretation:  Sinus rhythm Low voltage, precordial leads No  significant change since last tracing Confirmed by JACUBOWITZ   MD, SAM  [phone] on 01/18/2016 4:26:54 PM      MDM   Final diagnoses:  URI (upper respiratory infection)  Chest pain, unspecified chest pain type    Patient presents with cough chest pain and shortness of breath that started last night. Sister she with rhinorrhea. She has no fever. Her lungs are clear without wheezing. Her oxygen saturations are normal. She has no tachycardia or other symptoms that would be more suggestive of urinary embolus. She has no calf tenderness or pedal edema. Her chest x-ray is clear without evidence of pneumonia. Her EKG does not show any evidence of ischemia. She was discharged home in good condition. She was given a prescription for [employer] Perles. She was advised to follow-up with the primary care provider or return here as needed for any ongoing or worsening symptoms. She was given a referral on her last visit to a cardiologist to follow-up regarding her chest pain. I encouraged her to make that appointment if her chest pain continues although today her pain seems to be more associated with her coughing.  Rolan Bucco, MD 01/18/16 [phone]

## 2016-01-18 NOTE — ED Notes (Signed)
Pt seen here on Thursday with palpitations and now here with chest pain.

## 2016-02-07 NOTE — Progress Notes (Signed)
Cardiology Office Note   Date:  02/08/2016   ID:  [address] [place], [place] 1976-06-01, MRN [id number]  PCP:  Georgianne Fick, MD  Cardiologist:   Madilyn Hook, MD   Chief Complaint  Patient presents with  . New Patient (Initial Visit)    palpitations. sob sometimes, cp comes and goes. denies any le edema or claudication      History of Present Illness: Sharon Bryant Sharon Bryant is a 40 y.o. female with obesity and tobacco abuse who presents for follow up after being seen in the ED with chest pain.  She was seen in the ED on 3/10 and 3/14 with chest pain and palpitations.  She had an episode of palpitations on 3/10 that started while at work.  The episode lasted for over six hours.   She noted shortness of breath, lightheadedness and mild chest pain at the time.  The symptoms were worse with exertion.  She went to the ED, but reports that her symptoms had improved by the time she arrived.  No arrhythmias were noted on EKG or telemetry.  Since then she reports episodes of intermittent chest pain.  The pain is sharp and does not change with exertion.  It improves after having a bowel movement.  She wonders if it may be gas.  She was evaluated in the ED for chest pain on 3/14.  Sharon Bryant does not drink much caffeine or use over the counter decongestants.  She has not been feeling anxious or stressed.  She denies lower extremity edema, orthopnea or PND.  She continues to smoke cigarettes but has cut back from 1/2 ppd to 5 cigarettes daily.  She has been smoking for 15 years.  She tried Chantix in the past but it gave her headaches.  Now she tries chewing gum when she has cravings.    Sharon Bryant has not been exercising.  She gained 60lb in 2 years.  She notes that her diet is poor.   History reviewed. No pertinent past medical history.  Past Surgical History  Procedure Laterality Date  . Breast reduction surgery    . Tubal ligation    . Breast surgery       No  current outpatient prescriptions on file.   No current facility-administered medications for this visit.    Allergies:   Review of patient's allergies indicates no known allergies.    Social History:  The patient  reports that she has been smoking Cigarettes.  She has been smoking about 0.50 packs per day. She has never used smokeless tobacco. She reports that she drinks alcohol. She reports that she does not use illicit drugs.   Family History:  The patient's family history includes Cancer in her maternal aunt and paternal grandmother; Heart attack in her mother; Hypertension in her father; Hyperthyroidism in her mother; Kidney disease in her father; Stroke in her mother.    ROS:  Please see the history of present illness.   Otherwise, review of systems are positive for none.   All other systems are reviewed and negative.    PHYSICAL EXAM: VS:  BP 108/60 mmHg  Pulse 88  Ht  (1.651 m)  Wt 91.173 kg (201 lb)  BMI 33.45 kg/m2  LMP 01/06/2016 , BMI Body mass index is 33.45 kg/(m^2). GENERAL:  Well appearing HEENT:  Pupils equal round and reactive, fundi not visualized, oral mucosa unremarkable NECK:  No jugular venous distention, waveform within normal limits, carotid upstroke brisk  and symmetric, no bruits, no thyromegaly LYMPHATICS:  No cervical adenopathy LUNGS:  Clear to auscultation bilaterally HEART:  RRR.  PMI not displaced or sustained,S1 and S2 within normal limits, no S3, no S4, no clicks, no rubs, no murmurs ABD:  Flat, positive bowel sounds normal in frequency in pitch, no bruits, no rebound, no guarding, no midline pulsatile mass, no hepatomegaly, no splenomegaly EXT:  2 plus pulses throughout, no edema, no cyanosis no clubbing SKIN:  No rashes no nodules NEURO:  Cranial nerves II through XII grossly intact, motor grossly intact throughout PSYCH:  Cognitively intact, oriented to person place and time    EKG:  EKG is ordered today. The ekg ordered today  demonstrates sinus rhythm.  Rate 88 bpm.  Cannot rule out prior inferior MI.  Low voltage precordial leads.   Recent Labs: 01/18/2016: BUN 6; Creatinine, Ser 0.93; Hemoglobin 12.1; Platelets 238; Potassium 4.2; Sodium 137    Lipid Panel No results found for: CHOL, TRIG, HDL, CHOLHDL, VLDL, LDLCALC, LDLDIRECT    Wt Readings from Last 3 Encounters:  02/08/16 91.173 kg (201 lb)  02/18/15 91.808 kg (202 lb 6.4 oz)  05/28/14 87.998 kg (194 lb)      ASSESSMENT AND PLAN:  # Palpitations: Likely PVCs or PACs.  Given that her symptoms were ongoing for over 6 hour and nothing was seen on telemetry, doubt that there is any dangerous arrhythmia.  She had basic labs revealing that she was not anemic and had no electrolyte abnormalities.  She is scheduled for follow up with her PCP and prefers to have her thyroid checked at that time.   We will get a 14 day event monitor to investigate her arrhythmia.    # Atypical chest pain: Symptoms are very atypical.  Cardiac enzymes were negative.  However, her EKG is abnormal and she is a smoker.  We will get an exercise Cardiolite to evaluate for ischemia.   # Obesity: We discussed the importance of exercising at least 30-40 minutes most days of the week and making dietary changes.   # Tobacco abuse: We spent 5 minutes discussing the importance of smoking cessation.  She will try the patches.  Current medicines are reviewed at length with the patient today.  The patient does not have concerns regarding medicines.  The following changes have been made:  no change  Labs/ tests ordered today include:   Orders Placed This Encounter  Procedures  . Cardiac event monitor  . Myocardial Perfusion Imaging  . EKG 12-Lead     Disposition:   FU with Shatasia Cutshaw C. Duke Salviaandolph, MD, [facility] in 1 month    This note was written with the assistance of speech recognition software.  Please excuse any transcriptional errors.  Signed, Nomi Rudnicki C. Duke Salviaandolph, MD, [facility]  02/08/2016  11:31 PM    [facility]

## 2016-02-08 ENCOUNTER — Encounter: Payer: Self-pay | Admitting: Cardiovascular Disease

## 2016-02-08 ENCOUNTER — Ambulatory Visit (INDEPENDENT_AMBULATORY_CARE_PROVIDER_SITE_OTHER): Payer: 59 | Admitting: Cardiovascular Disease

## 2016-02-08 ENCOUNTER — Ambulatory Visit (INDEPENDENT_AMBULATORY_CARE_PROVIDER_SITE_OTHER): Payer: 59

## 2016-02-08 VITALS — BP 108/60 | HR 88 | Ht 65.0 in | Wt 201.0 lb

## 2016-02-08 DIAGNOSIS — R0789 Other chest pain: Secondary | ICD-10-CM

## 2016-02-08 DIAGNOSIS — R079 Chest pain, unspecified: Secondary | ICD-10-CM

## 2016-02-08 DIAGNOSIS — E669 Obesity, unspecified: Secondary | ICD-10-CM

## 2016-02-08 DIAGNOSIS — R002 Palpitations: Secondary | ICD-10-CM

## 2016-02-08 DIAGNOSIS — Z716 Tobacco abuse counseling: Secondary | ICD-10-CM

## 2016-02-08 DIAGNOSIS — R9431 Abnormal electrocardiogram [ECG] [EKG]: Secondary | ICD-10-CM

## 2016-02-08 NOTE — Patient Instructions (Addendum)
Medication Instructions:  Your physician recommends that you continue on your current medications as directed. Please refer to the Current Medication list given to you today.   Labwork: none  Testing/Procedures: Your physician has requested that you have en exercise stress myoview. For further information please visit [email or web address]. Please follow instruction sheet, as given. TWO DAY STUDY.  Your physician has recommended that you wear an event monitor. Event monitors are medical devices that record the heart's electrical activity. Doctors most often us these monitors to diagnose arrhythmias. Arrhythmias are problems with the speed or rhythm of the heartbeat. The monitor is a small, portable device. You can wear one while you do your normal daily activities. This is usually used to diagnose what is causing palpitations/syncope (passing out).    Follow-Up: Your physician recommends that you schedule a follow-up appointment in: 1 month.   Any Other Special Instructions Will Be Listed Below (If Applicable).     If you need a refill on your cardiac medications before your next appointment, please call your pharmacy.

## 2016-02-09 ENCOUNTER — Encounter: Payer: Self-pay | Admitting: Cardiovascular Disease

## 2016-02-09 DIAGNOSIS — R0789 Other chest pain: Secondary | ICD-10-CM | POA: Insufficient documentation

## 2016-02-09 DIAGNOSIS — R002 Palpitations: Secondary | ICD-10-CM | POA: Insufficient documentation

## 2016-02-09 HISTORY — DX: Palpitations: R00.2

## 2016-02-09 HISTORY — DX: Other chest pain: R07.89

## 2016-02-21 DIAGNOSIS — R002 Palpitations: Secondary | ICD-10-CM | POA: Diagnosis not present

## 2016-02-21 DIAGNOSIS — R079 Chest pain, unspecified: Secondary | ICD-10-CM

## 2016-02-21 DIAGNOSIS — R9431 Abnormal electrocardiogram [ECG] [EKG]: Secondary | ICD-10-CM | POA: Diagnosis not present

## 2016-02-24 ENCOUNTER — Telehealth (HOSPITAL_COMMUNITY): Payer: Self-pay

## 2016-02-24 NOTE — Telephone Encounter (Signed)
Encounter complete. 

## 2016-02-29 ENCOUNTER — Ambulatory Visit (HOSPITAL_COMMUNITY)
Admission: RE | Admit: 2016-02-29 | Discharge: 2016-02-29 | Disposition: A | Discharge status: Home / Self Care | Payer: 59 | Source: Ambulatory Visit | Attending: Cardiovascular Disease | Admitting: Cardiovascular Disease

## 2016-02-29 DIAGNOSIS — R9431 Abnormal electrocardiogram [ECG] [EKG]: Secondary | ICD-10-CM | POA: Diagnosis not present

## 2016-02-29 DIAGNOSIS — E669 Obesity, unspecified: Secondary | ICD-10-CM | POA: Insufficient documentation

## 2016-02-29 DIAGNOSIS — Z6833 Body mass index (BMI) 33.0-33.9, adult: Secondary | ICD-10-CM | POA: Diagnosis not present

## 2016-02-29 DIAGNOSIS — Z720 Tobacco use: Secondary | ICD-10-CM | POA: Insufficient documentation

## 2016-02-29 DIAGNOSIS — Z8249 Family history of ischemic heart disease and other diseases of the circulatory system: Secondary | ICD-10-CM | POA: Diagnosis not present

## 2016-02-29 DIAGNOSIS — R5383 Other fatigue: Secondary | ICD-10-CM | POA: Diagnosis not present

## 2016-02-29 DIAGNOSIS — R079 Chest pain, unspecified: Secondary | ICD-10-CM | POA: Diagnosis not present

## 2016-02-29 DIAGNOSIS — R0602 Shortness of breath: Secondary | ICD-10-CM | POA: Diagnosis not present

## 2016-02-29 DIAGNOSIS — R002 Palpitations: Secondary | ICD-10-CM | POA: Diagnosis not present

## 2016-02-29 LAB — MYOCARDIAL PERFUSION IMAGING
CHL CUP NUCLEAR SDS: 0
CHL CUP NUCLEAR SRS: 1
CHL CUP RESTING HR STRESS: 75 {beats}/min
CHL RATE OF PERCEIVED EXERTION: 17
CSEPED: 7 min
CSEPEDS: 40 s
CSEPEW: 8.3 METS
LV sys vol: 30 mL
LVDIAVOL: 80 mL (ref 46–106)
MPHR: 183 {beats}/min
Peak HR: 166 {beats}/min
Percent HR: 90 %
SSS: 1
TID: 1.13

## 2016-02-29 MED ORDER — TECHNETIUM TC 99M SESTAMIBI GENERIC - CARDIOLITE
9.9000 | Freq: Once | INTRAVENOUS | Status: AC | PRN
  Administered 2016-02-29: 9.9 via INTRAVENOUS

## 2016-02-29 MED ORDER — TECHNETIUM TC 99M SESTAMIBI GENERIC - CARDIOLITE
29.7000 | Freq: Once | INTRAVENOUS | Status: AC | PRN
  Administered 2016-02-29: 29.7 via INTRAVENOUS

## 2016-03-07 ENCOUNTER — Encounter: Payer: Self-pay | Admitting: Cardiovascular Disease

## 2016-03-09 ENCOUNTER — Ambulatory Visit: Payer: 59 | Admitting: Cardiovascular Disease

## 2016-11-14 IMAGING — NM NM MISC PROCEDURE
6 series · 36 of 36 positions shown · non-contrast
Comparison: none

[Series 1: wbr_r-proj_st wbr rest · 6.40mm/px · 6 of 64 frames shown]
[frame 6/64]
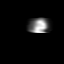
[frame 16/64]
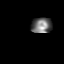
[frame 27/64]
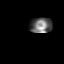
[frame 38/64]
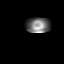
[frame 48/64]
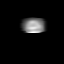
[frame 59/64]
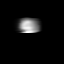

[Series 1: wbr rest · 6.40mm/px · 6 of 64 frames shown]
[frame 6/64]
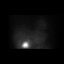
[frame 16/64]
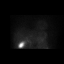
[frame 27/64]
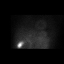
[frame 38/64]
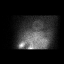
[frame 48/64]
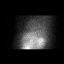
[frame 59/64]
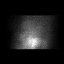

[Series 2: wbr stress-gsp · 6.40mm/px · 6 of 510 frames shown]
[frame 43/510]
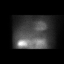
[frame 128/510]
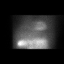
[frame 213/510]
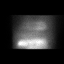
[frame 298/510]
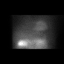
[frame 383/510]
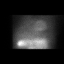
[frame 468/510]
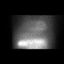

[Series 2: wbr_s-proj_st wbr stress-gsp · 6.40mm/px · 6 of 512 frames shown]
[frame 43/512]
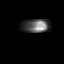
[frame 128/512]
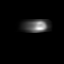
[frame 214/512]
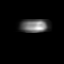
[frame 299/512]
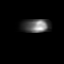
[frame 384/512]
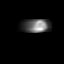
[frame 470/512]
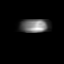

[Series 3: wbr stress-sum-em · 6.40mm/px · 6 of 64 frames shown]
[frame 6/64]
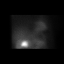
[frame 16/64]
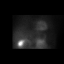
[frame 27/64]
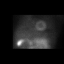
[frame 38/64]
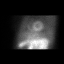
[frame 48/64]
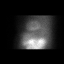
[frame 59/64]
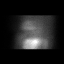

[Series 3: wbr_s-proj_st wbr stress-sum-em · 6.40mm/px · 6 of 64 frames shown]
[frame 6/64]
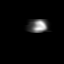
[frame 16/64]
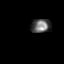
[frame 27/64]
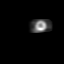
[frame 38/64]
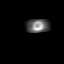
[frame 48/64]
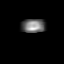
[frame 59/64]
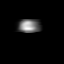

[36 of 36 positions shown; findings below may reference images not displayed]

Canned report from images found in remote index.

Refer to host system for actual result text.

## 2017-03-21 ENCOUNTER — Encounter: Payer: Self-pay | Admitting: Gynecology

## 2017-09-03 ENCOUNTER — Emergency Department (HOSPITAL_COMMUNITY): Payer: Self-pay

## 2017-09-03 ENCOUNTER — Encounter (HOSPITAL_COMMUNITY): Payer: Self-pay | Admitting: Emergency Medicine

## 2017-09-03 ENCOUNTER — Emergency Department (HOSPITAL_COMMUNITY)
Admission: EM | Admit: 2017-09-03 | Discharge: 2017-09-03 | Disposition: A | Discharge status: Home / Self Care | Payer: Self-pay | Attending: Emergency Medicine | Admitting: Emergency Medicine

## 2017-09-03 DIAGNOSIS — Y9389 Activity, other specified: Secondary | ICD-10-CM | POA: Insufficient documentation

## 2017-09-03 DIAGNOSIS — W08XXXA Fall from other furniture, initial encounter: Secondary | ICD-10-CM | POA: Insufficient documentation

## 2017-09-03 DIAGNOSIS — M25571 Pain in right ankle and joints of right foot: Secondary | ICD-10-CM

## 2017-09-03 DIAGNOSIS — F17210 Nicotine dependence, cigarettes, uncomplicated: Secondary | ICD-10-CM | POA: Insufficient documentation

## 2017-09-03 DIAGNOSIS — Y9289 Other specified places as the place of occurrence of the external cause: Secondary | ICD-10-CM | POA: Insufficient documentation

## 2017-09-03 DIAGNOSIS — Y990 Civilian activity done for income or pay: Secondary | ICD-10-CM | POA: Insufficient documentation

## 2017-09-03 DIAGNOSIS — S93401A Sprain of unspecified ligament of right ankle, initial encounter: Secondary | ICD-10-CM | POA: Insufficient documentation

## 2017-09-03 MED ORDER — OXYCODONE-ACETAMINOPHEN 5-325 MG PO TABS
1.0000 | ORAL_TABLET | Freq: Once | ORAL | Status: AC
  Administered 2017-09-03: 1 via ORAL

## 2017-09-03 MED ORDER — OXYCODONE-ACETAMINOPHEN 5-325 MG PO TABS
ORAL_TABLET | ORAL | Status: AC
  Filled 2017-09-03: qty 1

## 2017-09-03 MED ORDER — IBUPROFEN 800 MG PO TABS
800.0000 mg | ORAL_TABLET | Freq: Once | ORAL | Status: AC
  Administered 2017-09-03: 800 mg via ORAL
  Filled 2017-09-03: qty 1

## 2017-09-03 MED ORDER — IBUPROFEN 800 MG PO TABS: 800.0000 mg | ORAL_TABLET | Freq: Three times a day (TID) | ORAL | 0 refills | Status: DC

## 2017-09-03 NOTE — ED Notes (Signed)
Pt understood dc material. NAD Noted. Script given at dc 

## 2017-09-03 NOTE — Discharge Instructions (Signed)

## 2017-09-03 NOTE — ED Triage Notes (Signed)
Pt lost her balance on a stair and twisted her right ankle, foot is swollen at this time.

## 2017-09-03 NOTE — ED Provider Notes (Signed)
[facility] EMERGENCY DEPARTMENT Provider Note   CSN: [id number] Arrival date & time: 09/03/17  0442     History   Chief Complaint Chief Complaint  Patient presents with  . Ankle Injury    HPI Sharon Bryant Sharon Bryant is a 41 y.o. female with a hx of palpitations presents to the Emergency Department complaining of acute, persistent right ankle pain onset just prior to arrival.  Patient reports she was standing on a stool at work when she stepped off backwards and lost her balance inverting her foot.  Patient reports she heard a loud "pop" and lost her balance.  She did not hit her head or have a loss this.  Patient reports no numbness or tingling to her toes.  She has not been able to bear weight due to pain.  Patient reports associated decreased range of motion due to pain and swelling, especially along the lateral malleolus.  No treatments prior to arrival.       The history is provided by the patient and medical records. No language interpreter was used.    Past Medical History:  Diagnosis Date  . Atypical chest pain 02/09/2016  . Palpitations 02/09/2016    Patient Active Problem List   Diagnosis Date Noted  . Palpitations 02/09/2016  . Atypical chest pain 02/09/2016  . Obesity 02/18/2015  . Cigarette smoker one half pack a day or less 11/21/2013    Past Surgical History:  Procedure Laterality Date  . BREAST REDUCTION SURGERY    . BREAST SURGERY    . TUBAL LIGATION      OB History    Gravida Para Term Preterm AB Living   4 4       4    SAB TAB Ectopic Multiple Live Births                   Home Medications    Prior to Admission medications   Medication Sig Start Date End Date Taking? Authorizing Provider  ibuprofen (ADVIL,MOTRIN) 800 MG tablet Take 1 tablet (800 mg total) by mouth 3 (three) times daily. 09/03/17   Caiya Bettes, Dahlia Client, PA-C    Family History Family History  Problem Relation Age of Onset  . Hypertension Father   .  Kidney disease Father   . Cancer Maternal Aunt   . Cancer Paternal Grandmother   . Heart attack Mother   . Stroke Mother   . Hyperthyroidism Mother     Social History Social History  Substance Use Topics  . Smoking status: Current Every Day Smoker    Packs/day: 0.50    Types: Cigarettes  . Smokeless tobacco: Never Used  . Alcohol use Yes     Comment: occassionally     Allergies   Patient has no known allergies.   Review of Systems Review of Systems  Constitutional: Negative for chills and fever.  Gastrointestinal: Negative for nausea and vomiting.  Musculoskeletal: Positive for arthralgias, gait problem (Secondary to pain) and joint swelling. Negative for back pain, neck pain and neck stiffness.  Skin: Negative for wound.  Neurological: Negative for numbness.  Hematological: Does not bruise/bleed easily.  Psychiatric/Behavioral: The patient is not nervous/anxious.   All other systems reviewed and are negative.    Physical Exam Updated Vital Signs BP (!) 117/99 (BP Location: Right Arm)   Pulse 98   Temp 98.3 F (36.8 C) (Oral)   Resp 19   Ht 5\' 4"  (1.626 m)   Wt 87.1 kg (192 lb)  LMP 08/13/2017   SpO2 100%   BMI 32.96 kg/m   Physical Exam  Constitutional: She appears well-developed and well-nourished. No distress.  HENT:  Head: Normocephalic and atraumatic.  Eyes: Conjunctivae are normal.  Neck: Normal range of motion.  Cardiovascular: Normal rate, regular rhythm and intact distal pulses.   Capillary refill < 3 sec  Pulmonary/Chest: Effort normal and breath sounds normal.  Musculoskeletal: She exhibits tenderness. She exhibits no edema.  Right ankle: Significant tenderness to palpation along the lateral malleolus and lateral and dorsal portion of the right foot.  Sensation intact and normal touch throughout the distal foot and toes.  Strength 5/5 with flexion and extension of the great toe.  Strength 4/5 with flexion and extension of the ankle due to  significant pain.  Decreased range of motion of the right ankle due to pain.  Neurological: She is alert. Coordination normal.  Skin: Skin is warm and dry. She is not diaphoretic.  No tenting of the skin  Psychiatric: She has a normal mood and affect.  Nursing note and vitals reviewed.    ED Treatments / Results  Labs (all labs ordered are listed, but only abnormal results are displayed) Labs Reviewed - No data to display  EKG  EKG Interpretation None       Radiology Dg Ankle Complete Right  Result Date: 09/03/2017 CLINICAL DATA:  Fall from stool 1 hour ago.  Ankle and foot pain. EXAM: RIGHT ANKLE - COMPLETE 3+ VIEW; RIGHT FOOT COMPLETE - 3+ VIEW COMPARISON:  None. FINDINGS: RIGHT foot: There is no evidence of fracture, dislocation, or joint effusion. Small plantar calcaneal spur. There is no evidence of arthropathy or other focal bone abnormality. Small enchondroma versus projectional spurring. Soft tissues are unremarkable. RIGHT ankle: No fracture deformity nor dislocation. The ankle mortise appears congruent and the tibiofibular syndesmosis intact. No destructive bony lesions. Soft tissue swelling without subcutaneous gas or radiopaque foreign bodies. IMPRESSION: RIGHT foot: No acute fracture deformity or dislocation. RIGHT ankle: Soft tissue swelling without acute fracture deformity or dislocation. Electronically Signed   By: Awilda Metroourtnay  Bloomer M.D.   On: 09/03/2017 05:31   Dg Foot Complete Right  Result Date: 09/03/2017 CLINICAL DATA:  Fall from stool 1 hour ago.  Ankle and foot pain. EXAM: RIGHT ANKLE - COMPLETE 3+ VIEW; RIGHT FOOT COMPLETE - 3+ VIEW COMPARISON:  None. FINDINGS: RIGHT foot: There is no evidence of fracture, dislocation, or joint effusion. Small plantar calcaneal spur. There is no evidence of arthropathy or other focal bone abnormality. Small enchondroma versus projectional spurring. Soft tissues are unremarkable. RIGHT ankle: No fracture deformity nor dislocation.  The ankle mortise appears congruent and the tibiofibular syndesmosis intact. No destructive bony lesions. Soft tissue swelling without subcutaneous gas or radiopaque foreign bodies. IMPRESSION: RIGHT foot: No acute fracture deformity or dislocation. RIGHT ankle: Soft tissue swelling without acute fracture deformity or dislocation. Electronically Signed   By: Awilda Metroourtnay  Bloomer M.D.   On: 09/03/2017 05:31    Procedures Procedures (including critical care time)  Medications Ordered in ED Medications  oxyCODONE-acetaminophen (PERCOCET/ROXICET) 5-325 MG per tablet (not administered)  ibuprofen (ADVIL,MOTRIN) tablet 800 mg (not administered)  oxyCODONE-acetaminophen (PERCOCET/ROXICET) 5-325 MG per tablet 1 tablet (1 tablet Oral Given 09/03/17 0458)     Initial Impression / Assessment and Plan / ED Course  I have reviewed the triage vital signs and the nursing notes.  Pertinent labs & imaging results that were available during my care of the patient were reviewed by me and  considered in my medical decision making (see chart for details).     Patient X-Ray negative for obvious fracture or dislocation. Pain managed in ED. Pt advised to follow up with orthopedics if symptoms persist for possibility of missed fracture diagnosis or significant ligamentous injury. Patient given brace and crutches while in ED, conservative therapy recommended and discussed. Patient will be dc home & is agreeable with above plan.   Final Clinical Impressions(s) / ED Diagnoses   Final diagnoses:  Acute right ankle pain  Sprain of right ankle, unspecified ligament, initial encounter    New Prescriptions New Prescriptions   IBUPROFEN (ADVIL,MOTRIN) 800 MG TABLET    Take 1 tablet (800 mg total) by mouth 3 (three) times daily.     Reuben Knoblock, Boyd Kerbs 09/03/17 1610    Glynn Octave, MD 09/03/17 [phone]

## 2017-09-03 NOTE — ED Notes (Signed)
Ortho tech paged for crutches and cam walker

## 2017-11-21 DIAGNOSIS — H8110 Benign paroxysmal vertigo, unspecified ear: Secondary | ICD-10-CM | POA: Diagnosis not present

## 2017-11-21 DIAGNOSIS — N390 Urinary tract infection, site not specified: Secondary | ICD-10-CM | POA: Diagnosis not present

## 2018-01-16 ENCOUNTER — Encounter: Payer: Self-pay | Admitting: Women's Health

## 2018-01-16 ENCOUNTER — Ambulatory Visit (INDEPENDENT_AMBULATORY_CARE_PROVIDER_SITE_OTHER): Payer: BLUE CROSS/BLUE SHIELD | Admitting: Women's Health

## 2018-01-16 VITALS — BP 110/80 | Wt 200.0 lb

## 2018-01-16 DIAGNOSIS — N898 Other specified noninflammatory disorders of vagina: Secondary | ICD-10-CM

## 2018-01-16 DIAGNOSIS — Z01419 Encounter for gynecological examination (general) (routine) without abnormal findings: Secondary | ICD-10-CM

## 2018-01-16 DIAGNOSIS — Z0000 Encounter for general adult medical examination without abnormal findings: Secondary | ICD-10-CM | POA: Diagnosis not present

## 2018-01-16 DIAGNOSIS — N390 Urinary tract infection, site not specified: Secondary | ICD-10-CM | POA: Diagnosis not present

## 2018-01-16 DIAGNOSIS — R5383 Other fatigue: Secondary | ICD-10-CM | POA: Diagnosis not present

## 2018-01-16 LAB — WET PREP FOR TRICH, YEAST, CLUE

## 2018-01-16 MED ORDER — IBUPROFEN 600 MG PO TABS: 600.0000 mg | ORAL_TABLET | Freq: Three times a day (TID) | ORAL | 1 refills | Status: AC | PRN

## 2018-01-16 NOTE — Patient Instructions (Signed)
[facility] [phone]  ([address])  Health Maintenance, Female Adopting a healthy lifestyle and getting preventive care can go a long way to promote health and wellness. Talk with your health care provider about what schedule of regular examinations is right for you. This is a good chance for you to check in with your provider about disease prevention and staying healthy. In between checkups, there are plenty of things you can do on your own. Experts have done a lot of research about which lifestyle changes and preventive measures are most likely to keep you healthy. Ask your health care provider for more information. Weight and diet Eat a healthy diet  Be sure to include plenty of vegetables, fruits, low-fat dairy products, and lean protein.  Do not eat a lot of foods high in solid fats, added sugars, or salt.  Get regular exercise. This is one of the most important things you can do for your health. ? Most adults should exercise for at least 150 minutes each week. The exercise should increase your heart rate and make you sweat (moderate-intensity exercise). ? Most adults should also do strengthening exercises at least twice a week. This is in addition to the moderate-intensity exercise.  Maintain a healthy weight  Body mass index (BMI) is a measurement that can be used to identify possible weight problems. It estimates body fat based on height and weight. Your health care provider can help determine your BMI and help you achieve or maintain a healthy weight.  For females 23 years of age and older: ? A BMI below 18.5 is considered underweight. ? A BMI of 18.5 to 24.9 is normal. ? A BMI of 25 to 29.9 is considered overweight. ? A BMI of 30 and above is considered obese.  Watch levels of cholesterol and blood lipids  You should start having your blood tested for lipids and cholesterol at 40 years of age, then have this test every 5 years.  You may need to have your  cholesterol levels checked more often if: ? Your lipid or cholesterol levels are high. ? You are older than 40 years of age. ? You are at high risk for heart disease.  Cancer screening Lung Cancer  Lung cancer screening is recommended for adults 27-16 years old who are at high risk for lung cancer because of a history of smoking.  A yearly low-dose CT scan of the lungs is recommended for people who: ? Currently smoke. ? Have quit within the past 15 years. ? Have at least a 30-pack-year history of smoking. A pack year is smoking an average of one pack of cigarettes a day for 1 year.  Yearly screening should continue until it has been 15 years since you quit.  Yearly screening should stop if you develop a health problem that would prevent you from having lung cancer treatment.  Breast Cancer  Practice breast self-awareness. This means understanding how your breasts normally appear and feel.  It also means doing regular breast self-exams. Let your health care provider know about any changes, no matter how small.  If you are in your 20s or 30s, you should have a clinical breast exam (CBE) by a health care provider every 1-3 years as part of a regular health exam.  If you are 98 or older, have a CBE every year. Also consider having a breast X-ray (mammogram) every year.  If you have a family history of breast cancer, talk to your health care provider about  genetic screening.  If you are at high risk for breast cancer, talk to your health care provider about having an MRI and a mammogram every year.  Breast cancer gene (BRCA) assessment is recommended for women who have family members with BRCA-related cancers. BRCA-related cancers include: ? Breast. ? Ovarian. ? Tubal. ? Peritoneal cancers.  Results of the assessment will determine the need for genetic counseling and BRCA1 and BRCA2 testing.  Cervical Cancer Your health care provider may recommend that you be screened regularly  for cancer of the pelvic organs (ovaries, uterus, and vagina). This screening involves a pelvic examination, including checking for microscopic changes to the surface of your cervix (Pap test). You may be encouraged to have this screening done every 3 years, beginning at age 54.  For women ages 20-65, health care providers may recommend pelvic exams and Pap testing every 3 years, or they may recommend the Pap and pelvic exam, combined with testing for human papilloma virus (HPV), every 5 years. Some types of HPV increase your risk of cervical cancer. Testing for HPV may also be done on women of any age with unclear Pap test results.  Other health care providers may not recommend any screening for nonpregnant women who are considered low risk for pelvic cancer and who do not have symptoms. Ask your health care provider if a screening pelvic exam is right for you.  If you have had past treatment for cervical cancer or a condition that could lead to cancer, you need Pap tests and screening for cancer for at least 20 years after your treatment. If Pap tests have been discontinued, your risk factors (such as having a new sexual partner) need to be reassessed to determine if screening should resume. Some women have medical problems that increase the chance of getting cervical cancer. In these cases, your health care provider may recommend more frequent screening and Pap tests.  Colorectal Cancer  This type of cancer can be detected and often prevented.  Routine colorectal cancer screening usually begins at 40 years of age and continues through 40 years of age.  Your health care provider may recommend screening at an earlier age if you have risk factors for colon cancer.  Your health care provider may also recommend using home test kits to check for hidden blood in the stool.  A small camera at the end of a tube can be used to examine your colon directly (sigmoidoscopy or colonoscopy). This is done to  check for the earliest forms of colorectal cancer.  Routine screening usually begins at age 74.  Direct examination of the colon should be repeated every 5-10 years through 40 years of age. However, you may need to be screened more often if early forms of precancerous polyps or small growths are found.  Skin Cancer  Check your skin from head to toe regularly.  Tell your health care provider about any new moles or changes in moles, especially if there is a change in a mole's shape or color.  Also tell your health care provider if you have a mole that is larger than the size of a pencil eraser.  Always use sunscreen. Apply sunscreen liberally and repeatedly throughout the day.  Protect yourself by wearing long sleeves, pants, a wide-brimmed hat, and sunglasses whenever you are outside.  Heart disease, diabetes, and high blood pressure  High blood pressure causes heart disease and increases the risk of stroke. High blood pressure is more likely to develop in: ? People  People who have blood pressure in the high end of the normal range (130-139/85-89 mm Hg). ? People who are overweight or obese. ? People who are African American.  If you are 18-39 years of age, have your blood pressure checked every 3-5 years. If you are 40 years of age or older, have your blood pressure checked every year. You should have your blood pressure measured twice-once when you are at a hospital or clinic, and once when you are not at a hospital or clinic. Record the average of the two measurements. To check your blood pressure when you are not at a hospital or clinic, you can use: ? An automated blood pressure machine at a pharmacy. ? A home blood pressure monitor.  If you are between 55 years and 79 years old, ask your health care provider if you should take aspirin to prevent strokes.  Have regular diabetes screenings. This involves taking a blood sample to check your fasting blood sugar level. ? If you are at a  normal weight and have a low risk for diabetes, have this test once every three years after 40 years of age. ? If you are overweight and have a high risk for diabetes, consider being tested at a younger age or more often. Preventing infection Hepatitis B  If you have a higher risk for hepatitis B, you should be screened for this virus. You are considered at high risk for hepatitis B if: ? You were born in a country where hepatitis B is common. Ask your health care provider which countries are considered high risk. ? Your parents were born in a high-risk country, and you have not been immunized against hepatitis B (hepatitis B vaccine). ? You have HIV or AIDS. ? You use needles to inject street drugs. ? You live with someone who has hepatitis B. ? You have had sex with someone who has hepatitis B. ? You get hemodialysis treatment. ? You take certain medicines for conditions, including cancer, organ transplantation, and autoimmune conditions.  Hepatitis C  Blood testing is recommended for: ? Everyone born from 1945 through 1965. ? Anyone with known risk factors for hepatitis C.  Sexually transmitted infections (STIs)  You should be screened for sexually transmitted infections (STIs) including gonorrhea and chlamydia if: ? You are sexually active and are younger than 40 years of age. ? You are older than 40 years of age and your health care provider tells you that you are at risk for this type of infection. ? Your sexual activity has changed since you were last screened and you are at an increased risk for chlamydia or gonorrhea. Ask your health care provider if you are at risk.  If you do not have HIV, but are at risk, it may be recommended that you take a prescription medicine daily to prevent HIV infection. This is called pre-exposure prophylaxis (PrEP). You are considered at risk if: ? You are sexually active and do not regularly use condoms or know the HIV status of your  partner(s). ? You take drugs by injection. ? You are sexually active with a partner who has HIV.  Talk with your health care provider about whether you are at high risk of being infected with HIV. If you choose to begin PrEP, you should first be tested for HIV. You should then be tested every 3 months for as long as you are taking PrEP. Pregnancy  If you are premenopausal and you may become pregnant, ask your health   provider about preconception counseling.  If you may become pregnant, take 400 to 800 micrograms (mcg) of folic acid every day.  If you want to prevent pregnancy, talk to your health care provider about birth control (contraception). Osteoporosis and menopause  Osteoporosis is a disease in which the bones lose minerals and strength with aging. This can result in serious bone fractures. Your risk for osteoporosis can be identified using a bone density scan.  If you are 73 years of age or older, or if you are at risk for osteoporosis and fractures, ask your health care provider if you should be screened.  Ask your health care provider whether you should take a calcium or vitamin D supplement to lower your risk for osteoporosis.  Menopause may have certain physical symptoms and risks.  Hormone replacement therapy may reduce some of these symptoms and risks. Talk to your health care provider about whether hormone replacement therapy is right for you. Follow these instructions at home:  Schedule regular health, dental, and eye exams.  Stay current with your immunizations.  Do not use any tobacco products including cigarettes, chewing tobacco, or electronic cigarettes.  If you are pregnant, do not drink alcohol.  If you are breastfeeding, limit how much and how often you drink alcohol.  Limit alcohol intake to no more than 1 drink per day for nonpregnant women. One drink equals 12 ounces of beer, 5 ounces of wine, or 1 ounces of hard liquor.  Do not use street  drugs.  Do not share needles.  Ask your health care provider for help if you need support or information about quitting drugs.  Tell your health care provider if you often feel depressed.  Tell your health care provider if you have ever been abused or do not feel safe at home. This information is not intended to replace advice given to you by your health care provider. Make sure you discuss any questions you have with your health care provider. Document Released: 05/08/2011 Document Revised: 03/30/2016 Document Reviewed: 07/27/2015 Elsevier Interactive Patient Education  [employer].

## 2018-01-16 NOTE — Progress Notes (Signed)
lab

## 2018-01-16 NOTE — Progress Notes (Signed)
Sharon SchimkeBonita R Bryant Sharon Bryant 04-16-1976 [id number]    History:    Presents for annual exam with menstrual cramps that have been getting worse in the past 2 years, decrease in libido since her tubal ligation, and mild pain with intercourse. Monthly cycle/BTL. Abnormal Pap greater than 15 years ago, cryo-normal Paps after. Tried ibuprofen for menstrual cramps, with no relief. Reported mild discharge and odor. Denies urinary symptoms, fever, nausea. Quit smoking eight months ago. Labs done this morning at primary care, reports as normal in the past..  Past medical history, past surgical history, family history and social history were all reviewed and documented in the EPIC chart. Science writerAssistant manager of convenience store,  married, 4 sons and 4 stepchildren. Mother thyroid problems. Father diabetes, HTN, and kidney disease.   ROS:  A ROS was performed and pertinent positives and negatives are included.  Exam:  Vitals:   01/16/18 1403  BP: 110/80  Weight: 200 lb (90.7 kg)   Body mass index is 34.33 kg/m.   General appearance:  Normal Thyroid:  Symmetrical, normal in size, without palpable masses or nodularity. Respiratory  Auscultation:  Clear without wheezing or rhonchi Cardiovascular  Auscultation:  Regular rate, without rubs, murmurs or gallops  Edema/varicosities:  Not grossly evident Abdominal  Soft,nontender, without masses, guarding or rebound.  Liver/spleen:  No organomegaly noted  Hernia:  None appreciated  Skin  Inspection:  Grossly normal   Breasts: Examined lying and sitting.     Right: Without masses, retractions, discharge or axillary adenopathy.     Left: Without masses, retractions, discharge or axillary adenopathy. Gentitourinary   Inguinal/mons:  Normal without inguinal adenopathy  External genitalia:  Normal  BUS/Urethra/Skene's glands:  Normal  Vagina:  Normal  Cervix:  Normal  Uterus: normal in size, shape and contour.  Midline and mobile  Adnexa/parametria:      Rt: Without masses or tenderness.   Lt: Without masses or tenderness.  Anus and perineum: Normal  Digital rectal exam: Normal sphincter tone without palpated masses or tenderness  Assessment/Plan:  42 y.o. MBF G4P4 for annual exam with complaints of menstrual cramps, decrease in libido, and mild pain with intercourse. Wet prep negative.   Monthly cycle/BTL/dysmenorrhea Abnormal Pap >10 years ago, cryo, normal Paps since Decrease in libido Normal vaginal discharge Former smoker Obesity Primary care manages labs  Plan: SBEs, annual screening mammogram at 40, Vit D 1000 daily, calcium rich diet, decrease calories and increase regular exercise encouraged. Normal Pap 2015. Recommended taking time with husband to improve libido. Reassurance given regarding vaginal discharge, follow-up if symptoms persist or worsen.  Call if continued problems.  Motrin as need menstrual cramping.Pap with HR HPV typing, new screening guidelines reviewed. Congratulated on no smoking.  Harrington Challengerancy J Sallie Maker [facility] IncWHNP, 2:40 PM 01/16/2018

## 2018-01-21 LAB — PAP IG W/ RFLX HPV ASCU

## 2018-01-23 DIAGNOSIS — F419 Anxiety disorder, unspecified: Secondary | ICD-10-CM | POA: Diagnosis not present

## 2018-01-23 DIAGNOSIS — H8110 Benign paroxysmal vertigo, unspecified ear: Secondary | ICD-10-CM | POA: Diagnosis not present

## 2018-01-23 DIAGNOSIS — Z0000 Encounter for general adult medical examination without abnormal findings: Secondary | ICD-10-CM | POA: Diagnosis not present

## 2018-03-04 DIAGNOSIS — R071 Chest pain on breathing: Secondary | ICD-10-CM | POA: Diagnosis not present

## 2018-03-04 DIAGNOSIS — S46912A Strain of unspecified muscle, fascia and tendon at shoulder and upper arm level, left arm, initial encounter: Secondary | ICD-10-CM | POA: Diagnosis not present

## 2018-06-27 DIAGNOSIS — Z111 Encounter for screening for respiratory tuberculosis: Secondary | ICD-10-CM | POA: Diagnosis not present

## 2018-09-18 DIAGNOSIS — N390 Urinary tract infection, site not specified: Secondary | ICD-10-CM | POA: Diagnosis not present

## 2018-09-19 DIAGNOSIS — N941 Unspecified dyspareunia: Secondary | ICD-10-CM | POA: Diagnosis not present

## 2018-09-19 DIAGNOSIS — N760 Acute vaginitis: Secondary | ICD-10-CM | POA: Diagnosis not present

## 2018-09-19 DIAGNOSIS — F419 Anxiety disorder, unspecified: Secondary | ICD-10-CM | POA: Diagnosis not present

## 2018-11-10 ENCOUNTER — Emergency Department (HOSPITAL_COMMUNITY)
Admission: EM | Admit: 2018-11-10 | Discharge: 2018-11-10 | Disposition: A | Discharge status: Home / Self Care | Payer: BLUE CROSS/BLUE SHIELD | Attending: Emergency Medicine | Admitting: Emergency Medicine

## 2018-11-10 ENCOUNTER — Other Ambulatory Visit: Payer: Self-pay

## 2018-11-10 ENCOUNTER — Encounter (HOSPITAL_COMMUNITY): Payer: Self-pay | Admitting: Emergency Medicine

## 2018-11-10 DIAGNOSIS — Y939 Activity, unspecified: Secondary | ICD-10-CM | POA: Diagnosis not present

## 2018-11-10 DIAGNOSIS — F17210 Nicotine dependence, cigarettes, uncomplicated: Secondary | ICD-10-CM | POA: Insufficient documentation

## 2018-11-10 DIAGNOSIS — X58XXXA Exposure to other specified factors, initial encounter: Secondary | ICD-10-CM | POA: Diagnosis not present

## 2018-11-10 DIAGNOSIS — Y929 Unspecified place or not applicable: Secondary | ICD-10-CM | POA: Diagnosis not present

## 2018-11-10 DIAGNOSIS — Y999 Unspecified external cause status: Secondary | ICD-10-CM | POA: Insufficient documentation

## 2018-11-10 DIAGNOSIS — Z79899 Other long term (current) drug therapy: Secondary | ICD-10-CM | POA: Diagnosis not present

## 2018-11-10 DIAGNOSIS — S39012A Strain of muscle, fascia and tendon of lower back, initial encounter: Secondary | ICD-10-CM

## 2018-11-10 MED ORDER — HYDROCODONE-ACETAMINOPHEN 5-325 MG PO TABS
1.0000 | ORAL_TABLET | Freq: Once | ORAL | Status: AC
  Administered 2018-11-10: 1 via ORAL
  Filled 2018-11-10: qty 1

## 2018-11-10 MED ORDER — HYDROCODONE-ACETAMINOPHEN 5-325 MG PO TABS: 1.0000 | ORAL_TABLET | Freq: Four times a day (QID) | ORAL | 0 refills | Status: DC | PRN

## 2018-11-10 NOTE — ED Triage Notes (Addendum)
Back pain lower since Friday that radiates to rt  hip and down her legs denies injury and denies dysuria

## 2018-11-10 NOTE — ED Provider Notes (Signed)
[facility]   CSN: [id number] Arrival date & time: 11/10/18  1305     History   Chief Complaint Chief Complaint  Patient presents with  . Back Pain    HPI Sharon Bryant is a 43 y.o. female.  HPI Complains of right sided paralumbar back pain onset 3 days ago, gradually.  Pain is worse with changing positions and improved with remaining still.  Pain does not radiate down her legs.  She denies loss of bladder or bowel control denies fever denies injury.  No other associated symptoms.  Treated with ibuprofen and Tylenol, without adequate relief.  No other associated symptoms Past Medical History:  Diagnosis Date  . Atypical chest pain 02/09/2016  . Palpitations 02/09/2016    Patient Active Problem List   Diagnosis Date Noted  . Palpitations 02/09/2016  . Atypical chest pain 02/09/2016  . Obesity 02/18/2015    Past Surgical History:  Procedure Laterality Date  . BREAST REDUCTION SURGERY    . BREAST SURGERY    . TUBAL LIGATION       OB History    Gravida  4   Para  4   Term      Preterm      AB      Living  4     SAB      TAB      Ectopic      Multiple      Live Births               Home Medications    Prior to Admission medications   Medication Sig Start Date End Date Taking? Authorizing Provider  HYDROcodone-acetaminophen (NORCO) 5-325 MG tablet Take 1-2 tablets by mouth every 6 (six) hours as needed for severe pain. 11/10/18   Doug SouJacubowitz, Devonne Lalani, MD  ibuprofen (ADVIL,MOTRIN) 600 MG tablet Take 1 tablet (600 mg total) by mouth every 8 (eight) hours as needed. 01/16/18   Harrington ChallengerYoung, Nancy J, NP    Family History Family History  Problem Relation Age of Onset  . Hypertension Father   . Kidney disease Father   . Cancer Maternal Aunt   . Cancer Paternal Grandmother   . Heart attack Mother   . Stroke Mother   . Hyperthyroidism Mother     Social History Social History   Tobacco Use  .  Smoking status: Current Every Day Smoker    Packs/day: 0.50    Types: Cigarettes    Last attempt to quit: 07/19/2017    Years since quitting: 1.3  . Smokeless tobacco: Never Used  Substance Use Topics  . Alcohol use: Yes    Comment: occassionally  . Drug use: No     Allergies   Patient has no known allergies.   Review of Systems Review of Systems  Constitutional: Negative.   Genitourinary:       Currently on menses  Musculoskeletal: Positive for back pain.     Physical Exam Updated Vital Signs BP (!) 102/54 (BP Location: Left Arm)   Pulse 78   Temp 98.3 F (36.8 C) (Oral)   Resp 16   SpO2 98%   Physical Exam Vitals signs and nursing note reviewed.  Constitutional:      Appearance: She is well-developed. She is not ill-appearing.  HENT:     Head: Normocephalic and atraumatic.  Eyes:     Conjunctiva/sclera: Conjunctivae normal.     Pupils: Pupils are equal, round, and reactive to  light.  Neck:     Musculoskeletal: Neck supple.     Thyroid: No thyromegaly.     Trachea: No tracheal deviation.  Cardiovascular:     Rate and Rhythm: Normal rate and regular rhythm.     Heart sounds: No murmur.  Pulmonary:     Effort: Pulmonary effort is normal.     Breath sounds: Normal breath sounds.  Abdominal:     General: Bowel sounds are normal. There is no distension.     Palpations: Abdomen is soft.     Tenderness: There is no abdominal tenderness.  Musculoskeletal: Normal range of motion.        General: No tenderness.     Comments: Entire spine is nontender.  She has pain at right paralumbar area upon standing from seated position.  Gait is normal though she has pain with walking and walks slightly flexed at the waist.  Skin:    General: Skin is warm and dry.     Findings: No rash.  Neurological:     Mental Status: She is alert.     Coordination: Coordination normal.     Comments: DTRs symmetric bilaterally at knee jerk ankle jerk.  Toes downgoing bilaterally.  Motor  strength 5/5 overall      ED Treatments / Results  Labs (all labs ordered are listed, but only abnormal results are displayed) Labs Reviewed - No data to display  EKG None  Radiology No results found.  Procedures Procedures (including critical care time)  Medications Ordered in ED Medications  HYDROcodone-acetaminophen (NORCO/VICODIN) 5-325 MG per tablet 1 tablet (has no administration in time range)     Initial Impression / Assessment and Plan / ED Course  I have reviewed the triage vital signs and the nursing notes.  Pertinent labs & imaging results that were available during my care of the patient were reviewed by me and considered in my medical decision making (see chart for details).     Plan prescription Norco.[place] Controlled Substance reporting System queried.  Follow-up with PMD if significant pain in 3 or 4 days Imaging not indicated discussed with patient who agrees Final Clinical Impressions(s) / ED Diagnoses   Final diagnoses:  Strain of lumbar region, initial encounter    ED Discharge Orders         Ordered    HYDROcodone-acetaminophen (NORCO) 5-325 MG tablet  Every 6 hours PRN     11/10/18 1514           Doug SouJacubowitz, Susanne Baumgarner, MD 11/10/18 [phone]

## 2018-11-10 NOTE — Discharge Instructions (Signed)
You can take ibuprofen 800 mg (4 Advil tablets) 3 times daily in addition to the medication prescribed.  See Dr.Ramachadran in the office if you continue to have significant pain in 3 or 4 days

## 2018-11-10 NOTE — ED Notes (Signed)
Patient verbalizes understanding of discharge instructions. Opportunity for questioning and answers were provided. Armband removed by staff, pt discharged from ED in wheelchair.  

## 2019-01-08 DIAGNOSIS — F419 Anxiety disorder, unspecified: Secondary | ICD-10-CM | POA: Diagnosis not present

## 2019-01-08 DIAGNOSIS — J069 Acute upper respiratory infection, unspecified: Secondary | ICD-10-CM | POA: Diagnosis not present

## 2019-01-20 ENCOUNTER — Encounter: Payer: BLUE CROSS/BLUE SHIELD | Admitting: Women's Health

## 2019-02-24 ENCOUNTER — Other Ambulatory Visit: Payer: Self-pay

## 2019-02-25 ENCOUNTER — Encounter: Payer: BLUE CROSS/BLUE SHIELD | Admitting: Women's Health

## 2019-02-26 ENCOUNTER — Encounter: Payer: BLUE CROSS/BLUE SHIELD | Admitting: Women's Health

## 2019-06-06 DIAGNOSIS — Z0000 Encounter for general adult medical examination without abnormal findings: Secondary | ICD-10-CM | POA: Diagnosis not present

## 2019-06-10 DIAGNOSIS — Z0000 Encounter for general adult medical examination without abnormal findings: Secondary | ICD-10-CM | POA: Diagnosis not present

## 2019-06-10 DIAGNOSIS — F419 Anxiety disorder, unspecified: Secondary | ICD-10-CM | POA: Diagnosis not present

## 2019-06-10 DIAGNOSIS — Z7189 Other specified counseling: Secondary | ICD-10-CM | POA: Diagnosis not present

## 2019-06-10 DIAGNOSIS — L0292 Furuncle, unspecified: Secondary | ICD-10-CM | POA: Diagnosis not present

## 2019-06-22 DIAGNOSIS — Z20828 Contact with and (suspected) exposure to other viral communicable diseases: Secondary | ICD-10-CM | POA: Diagnosis not present

## 2019-07-10 DIAGNOSIS — Z7189 Other specified counseling: Secondary | ICD-10-CM | POA: Diagnosis not present

## 2019-07-10 DIAGNOSIS — F411 Generalized anxiety disorder: Secondary | ICD-10-CM | POA: Diagnosis not present

## 2019-08-26 DIAGNOSIS — F411 Generalized anxiety disorder: Secondary | ICD-10-CM | POA: Diagnosis not present

## 2019-08-26 DIAGNOSIS — L0291 Cutaneous abscess, unspecified: Secondary | ICD-10-CM | POA: Diagnosis not present

## 2019-09-02 DIAGNOSIS — F411 Generalized anxiety disorder: Secondary | ICD-10-CM | POA: Diagnosis not present

## 2019-09-02 DIAGNOSIS — L0291 Cutaneous abscess, unspecified: Secondary | ICD-10-CM | POA: Diagnosis not present

## 2019-09-10 DIAGNOSIS — Z7189 Other specified counseling: Secondary | ICD-10-CM | POA: Diagnosis not present

## 2019-09-10 DIAGNOSIS — Z111 Encounter for screening for respiratory tuberculosis: Secondary | ICD-10-CM | POA: Diagnosis not present

## 2019-09-10 DIAGNOSIS — F411 Generalized anxiety disorder: Secondary | ICD-10-CM | POA: Diagnosis not present

## 2019-09-10 DIAGNOSIS — L0292 Furuncle, unspecified: Secondary | ICD-10-CM | POA: Diagnosis not present

## 2019-12-29 ENCOUNTER — Other Ambulatory Visit: Payer: Self-pay

## 2019-12-29 ENCOUNTER — Telehealth: Payer: Self-pay | Admitting: *Deleted

## 2019-12-29 ENCOUNTER — Encounter: Payer: Self-pay | Admitting: Women's Health

## 2019-12-29 ENCOUNTER — Ambulatory Visit: Payer: BC Managed Care – PPO | Admitting: Women's Health

## 2019-12-29 VITALS — BP 130/78

## 2019-12-29 DIAGNOSIS — N61 Mastitis without abscess: Secondary | ICD-10-CM | POA: Diagnosis not present

## 2019-12-29 MED ORDER — DICLOXACILLIN SODIUM 500 MG PO CAPS: 500.0000 mg | ORAL_CAPSULE | Freq: Three times a day (TID) | ORAL | 0 refills | Status: DC

## 2019-12-29 NOTE — Progress Notes (Signed)
44 year old MBF G4 +2 stepdaughters presents with complaint of left breast tenderness for the last 5 days with increasing intensity.  Denies any injury, change in routine.  Regular monthly cycle/BTL.  Smokes less than 5 cigarettes daily.  Has not had a screening mammogram.  No known medical problems.  Exam: Appears well, worried.  Breast examined sitting and lying position right breast without palpable nodules, retractions, or nipple discharge, left breast at 6 to 8 o'clock position 5cm erythemic, tender to touch firm and warm, not indurated.  Left breast mastitis  Plan: Dicloxacillin 500 mg 3 times daily for 7 days, instructed to call if no relief of tenderness, and erythema.  We will schedule diagnostic left breast mammogram in 2 weeks.  Reassurance given.  Instructed to keep the area clean and dry, alternate Tylenol Motrin for the discomfort.  Annual exam due instructed to schedule.  Aware of hazards of smoking tips to quit discussed.

## 2019-12-29 NOTE — Patient Instructions (Signed)
Mastitis  Mastitis is inflammation of the breast tissue. It occurs most often in women who are breastfeeding, but it can also affect other women, and sometimes even men. What are the causes? This condition is usually caused by a bacterial infection. Bacteria enter the breast tissue through cuts or openings in the skin. Typically, this occurs with breastfeeding because of cracked or irritated nipples. Sometimes, it can occur when there is no opening in the skin. This is usually caused by plugged milk ducts. Other causes include:  Nipple piercing.  Some forms of breast cancer. What are the signs or symptoms? Symptoms of this condition include:  Swelling, redness, tenderness, and pain in an area of the breast. The area may also feel warm to the touch. These symptoms usually affect the upper part of the breast, toward the armpit region.  Swelling of the glands under the arm on the same side.  Fever.  Rapid pulse.  Fatigue, headache, and flu-like muscle aches. If an infection is allowed to progress, a collection of pus (abscess) may develop. How is this diagnosed? This condition can usually be diagnosed based on a physical exam and your symptoms. You may also have other tests, such as:  Blood tests to determine if your body is fighting a bacterial infection.  Mammogram or ultrasound tests to rule out other problems or diseases.  Testing of pus and other fluids. Pus from the breast may be collected and examined in the lab. If an abscess has developed, the fluid in the abscess can be removed with a needle. This test can be used to confirm the diagnosis and identify the bacteria present.  If you are breastfeeding, breast milk may be cultured and tested for bacteria. How is this treated? Treatment for this condition may include:  Applying heat or cold compresses to the affected area.  Medicine for pain.  Antibiotic medicine to treat a bacterial infection. This is usually taken by  mouth.  Self-care such as rest and increased fluid intake.  If an abscess has developed, it may be treated by removing fluid with a needle. Mastitis that occurs with breastfeeding will sometimes go away on its own, so your health care provider may choose to wait 24 hours after first seeing you to decide whether a prescription medicine is needed. You may be told of different ways to help manage breastfeeding, such as continuing to breastfeed or pump in order to ensure adequate milk flow. Follow these instructions at home: Medicines  Take over-the-counter and prescription medicines only as told by your health care provider.  If you were prescribed an antibiotic medicine, take it as told by your health care provider. Do not stop taking the antibiotic even if you start to feel better. General instructions  Do not wear a tight or underwire bra. Wear a soft, supportive bra.  Increase your fluid intake, especially if you have a fever.  Get plenty of rest. If you are breastfeeding:  Continue to empty your breasts as often as possible either by breastfeeding or by using a breast pump. This will decrease the pressure and the pain that comes with it. Ask your health care provider if changes need to be made to your breastfeeding or pumping routine.  Keep your nipples clean and dry.  During breastfeeding, empty the first breast completely before going to the other breast. If your baby is not emptying your breasts completely, use a breast pump to empty your breasts.  Use breast massage during feeding or pumping sessions.    If directed, apply moist heat to the affected area of your breast right before breastfeeding or pumping. Use the heat source that your health care provider recommends.  If directed, put ice on the affected area of your breast right after breastfeeding or pumping: ? Put ice in a plastic bag. ? Place a towel between your skin and the bag. ? Leave the ice on for 20 minutes.  If  you go back to work, pump your breasts while at work to stay within your nursing schedule.  Avoid allowing your breasts to become overly filled with milk (engorged). Contact a health care provider if:  You have pus-like discharge from the breast.  You have a fever.  Your symptoms do not improve within 2 days of starting treatment. Get help right away if:  Your pain and swelling are getting worse.  You have pain that is not controlled with medicine.  You have a red line extending from the breast toward your armpit. Summary  Mastitis is inflammation of the breast tissue. It occurs most often in women who are breastfeeding, but it can also affect non-breastfeeding women and some men.  This condition is usually caused by a bacterial infection.  This condition may be treated with hot and cold compresses, medicines, self-care, and certain breastfeeding strategies.  If you were prescribed an antibiotic medicine, take it as told by your health care provider. Do not stop taking the antibiotic even if you start to feel better. This information is not intended to replace advice given to you by your health care provider. Make sure you discuss any questions you have with your health care provider. Document Revised: 10/05/2017 Document Reviewed: 11/14/2016 Elsevier Patient Education  2020 [employer]  

## 2019-12-29 NOTE — Telephone Encounter (Signed)
Patient scheduled at the breast center 01/12/20 @ 3:00pm. Patient informed

## 2019-12-29 NOTE — Telephone Encounter (Signed)
-----   Message from Harrington Challenger, NP sent at 12/29/2019  2:28 PM EST ----- Patient has left-sided mastitis at 6 to 8 o'clock position 5 cm area is being treated with an antibiotic for 7 days and will need a mammogram in 2 weeks any day is okay afternoon is best.  please schedule diagnostic left mammogram and right mammogram history of breast reduction and has not had a screening.

## 2020-01-12 ENCOUNTER — Ambulatory Visit
Admission: RE | Admit: 2020-01-12 | Discharge: 2020-01-12 | Disposition: A | Discharge status: Home / Self Care | Payer: BLUE CROSS/BLUE SHIELD | Source: Ambulatory Visit | Attending: Women's Health | Admitting: Women's Health

## 2020-01-12 ENCOUNTER — Other Ambulatory Visit: Payer: Self-pay

## 2020-01-12 DIAGNOSIS — N61 Mastitis without abscess: Secondary | ICD-10-CM

## 2020-01-12 DIAGNOSIS — N644 Mastodynia: Secondary | ICD-10-CM | POA: Diagnosis not present

## 2020-01-12 DIAGNOSIS — R928 Other abnormal and inconclusive findings on diagnostic imaging of breast: Secondary | ICD-10-CM | POA: Diagnosis not present

## 2020-01-14 ENCOUNTER — Other Ambulatory Visit: Payer: Self-pay

## 2020-01-14 MED ORDER — DICLOXACILLIN SODIUM 500 MG PO CAPS: 500.0000 mg | ORAL_CAPSULE | Freq: Three times a day (TID) | ORAL | 0 refills | Status: DC

## 2020-02-09 ENCOUNTER — Other Ambulatory Visit: Payer: Self-pay

## 2020-02-10 ENCOUNTER — Encounter: Payer: Self-pay | Admitting: Women's Health

## 2020-02-10 ENCOUNTER — Ambulatory Visit (INDEPENDENT_AMBULATORY_CARE_PROVIDER_SITE_OTHER): Payer: BC Managed Care – PPO | Admitting: Women's Health

## 2020-02-10 VITALS — BP 130/80 | Ht 63.0 in | Wt 208.0 lb

## 2020-02-10 DIAGNOSIS — Z01419 Encounter for gynecological examination (general) (routine) without abnormal findings: Secondary | ICD-10-CM

## 2020-02-10 NOTE — Patient Instructions (Signed)
It has been a pleasure knowing you Vitamin D 1 hi Sharon Bryant here 000 IUs daily Health Maintenance, Female Adopting a healthy lifestyle and getting preventive care are important in promoting health and wellness. Ask your health care provider about:  The right schedule for you to have regular tests and exams.  Things you can do on your own to prevent diseases and keep yourself healthy. What should I know about diet, weight, and exercise? Eat a healthy diet   Eat a diet that includes plenty of vegetables, fruits, low-fat dairy products, and lean protein.  Do not eat a lot of foods that are high in solid fats, added sugars, or sodium. Maintain a healthy weight Body mass index (BMI) is used to identify weight problems. It estimates body fat based on height and weight. Your health care provider can help determine your BMI and help you achieve or maintain a healthy weight. Get regular exercise Get regular exercise. This is one of the most important things you can do for your health. Most adults should:  Exercise for at least 150 minutes each week. The exercise should increase your heart rate and make you sweat (moderate-intensity exercise).  Do strengthening exercises at least twice a week. This is in addition to the moderate-intensity exercise.  Spend less time sitting. Even light physical activity can be beneficial. Watch cholesterol and blood lipids Have your blood tested for lipids and cholesterol at 42 years of age, then have this test every 5 years. Have your cholesterol levels checked more often if:  Your lipid or cholesterol levels are high.  You are older than 42 years of age.  You are at high risk for heart disease. What should I know about cancer screening? Depending on your health history and family history, you may need to have cancer screening at various ages. This may include screening for:  Breast cancer.  Cervical cancer.  Colorectal cancer.  Skin  cancer.  Lung cancer. What should I know about heart disease, diabetes, and high blood pressure? Blood pressure and heart disease  High blood pressure causes heart disease and increases the risk of stroke. This is more likely to develop in people who have high blood pressure readings, are of African descent, or are overweight.  Have your blood pressure checked: ? Every 3-5 years if you are 68-20 years of age. ? Every year if you are 34 years old or older. Diabetes Have regular diabetes screenings. This checks your fasting blood sugar level. Have the screening done:  Once every three years after age 4 if you are at a normal weight and have a low risk for diabetes.  More often and at a younger age if you are overweight or have a high risk for diabetes. What should I know about preventing infection? Hepatitis B If you have a higher risk for hepatitis B, you should be screened for this virus. Talk with your health care provider to find out if you are at risk for hepatitis B infection. Hepatitis C Testing is recommended for:  Everyone born from [birth year] through 1963.  Anyone with known risk factors for hepatitis C. Sexually transmitted infections (STIs)  Get screened for STIs, including gonorrhea and chlamydia, if: ? You are sexually active and are younger than 42 years of age. ? You are older than 42 years of age and your health care provider tells you that you are at risk for this type of infection. ? Your sexual activity has changed since you were  last screened, and you are at increased risk for chlamydia or gonorrhea. Ask your health care provider if you are at risk.  Ask your health care provider about whether you are at high risk for HIV. Your health care provider may recommend a prescription medicine to help prevent HIV infection. If you choose to take medicine to prevent HIV, you should first get tested for HIV. You should then be tested every 3 months for as long as you are taking  the medicine. Pregnancy  If you are about to stop having your period (premenopausal) and you may become pregnant, seek counseling before you get pregnant.  Take 400 to 800 micrograms (mcg) of folic acid every day if you become pregnant.  Ask for birth control (contraception) if you want to prevent pregnancy. Osteoporosis and menopause Osteoporosis is a disease in which the bones lose minerals and strength with aging. This can result in bone fractures. If you are 62 years old or older, or if you are at risk for osteoporosis and fractures, ask your health care provider if you should:  Be screened for bone loss.  Take a calcium or vitamin D supplement to lower your risk of fractures.  Be given hormone replacement therapy (HRT) to treat symptoms of menopause. Follow these instructions at home: Lifestyle  Do not use any products that contain nicotine or tobacco, such as cigarettes, e-cigarettes, and chewing tobacco. If you need help quitting, ask your health care provider.  Do not use street drugs.  Do not share needles.  Ask your health care provider for help if you need support or information about quitting drugs. Alcohol use  Do not drink alcohol if: ? Your health care provider tells you not to drink. ? You are pregnant, may be pregnant, or are planning to become pregnant.  If you drink alcohol: ? Limit how much you use to 0-1 drink a day. ? Limit intake if you are breastfeeding.  Be aware of how much alcohol is in your drink. In the [place] one drink equals one 12 oz bottle of beer (355 mL), one 5 oz glass of wine (148 mL), or one 1 oz glass of hard liquor (44 mL). General instructions  Schedule regular health, dental, and eye exams.  Stay current with your vaccines.  Tell your health care provider if: ? You often feel depressed. ? You have ever been abused or do not feel safe at home. Summary  Adopting a healthy lifestyle and getting preventive care are important in  promoting health and wellness.  Follow your health care provider's instructions about healthy diet, exercising, and getting tested or screened for diseases.  Follow your health care provider's instructions on monitoring your cholesterol and blood pressure. This information is not intended to replace advice given to you by your health care provider. Make sure you discuss any questions you have with your health care provider. Document Revised: 10/16/2018 Document Reviewed: 10/16/2018 Elsevier Patient Education  2020 [employer].

## 2020-02-10 NOTE — Progress Notes (Signed)
Sharon Bryant Delford Field 1976-10-18 [id number]    History:    Presents for annual exam.  Monthly cycle/BTL.  History of cryo greater than 15 years ago with normal Paps after.  In the process is quitting smoking smokes less than half a pack per day.  Normal mammogram after ultrasound 01/2020.  History of a breast reduction.  Past medical history, past surgical history, family history and social history were all reviewed and documented in the EPIC chart.  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  Father diabetes, hypertension and kidney disease, mother thyroid disease.  4 sons, youngest age 89 and 2 stepdaughters.  Husband bought her a new car for their anniversary.  ROS:  A ROS was performed and pertinent positives and negatives are included.  Exam:  Vitals:   02/10/20 1605  BP: 130/80  Weight: 208 lb (94.3 kg)  Height: 5\' 3"  (1.6 m)   Body mass index is 36.85 kg/m.   General appearance:  Normal Thyroid:  Symmetrical, normal in size, without palpable masses or nodularity. Respiratory  Auscultation:  Clear without wheezing or rhonchi Cardiovascular  Auscultation:  Regular rate, without rubs, murmurs or gallops  Edema/varicosities:  Not grossly evident Abdominal  Soft,nontender, without masses, guarding or rebound.  Liver/spleen:  No organomegaly noted  Hernia:  None appreciated  Skin  Inspection:  Grossly normal   Breasts: Examined lying and sitting. Bilateral breast reduction well-healed scars    Right: Without masses, retractions, discharge or axillary adenopathy.     Left: Without masses, retractions, discharge or axillary adenopathy. Gentitourinary   Inguinal/mons:  Normal without inguinal adenopathy  External genitalia:  Normal  BUS/Urethra/Skene's glands:  Normal  Vagina:  Normal  Cervix:  Normal  Uterus:  normal in size, shape and contour.  Midline and mobile  Adnexa/parametria:     Rt: Without masses or tenderness.   Lt: Without masses or tenderness.  Anus and  perineum: Normal  Digital rectal exam: Normal sphincter tone without palpated masses or tenderness  Assessment/Plan:  44 y.o. MBF G4, P4 +2 stepdaughters for annual exam with no complaint of vaginal discharge, urinary symptoms, abdominal pain or dyspareunia.  Regular monthly heavy cycle/BTL Smoker less than a half pack daily Obesity Labs-primary care reports as normal  Plan:.  SBEs, continue annual 3D screening mammogram, calcium rich foods, vitamin D 1000 IUs daily.  Aware of the importance of no smoking process of quitting tips for quitting discussed.  Daily 30 minutes walking, low carb/calorie diet encouraged.  Pap normal 2019, new screening guidelines reviewed.   2020 [facility], 4:52 PM 02/10/2020

## 2020-09-07 DIAGNOSIS — Z03818 Encounter for observation for suspected exposure to other biological agents ruled out: Secondary | ICD-10-CM | POA: Diagnosis not present

## 2020-09-07 DIAGNOSIS — Z20822 Contact with and (suspected) exposure to covid-19: Secondary | ICD-10-CM | POA: Diagnosis not present

## 2020-10-15 DIAGNOSIS — Z20822 Contact with and (suspected) exposure to covid-19: Secondary | ICD-10-CM | POA: Diagnosis not present

## 2020-10-15 DIAGNOSIS — U071 COVID-19: Secondary | ICD-10-CM | POA: Diagnosis not present

## 2020-10-19 ENCOUNTER — Telehealth (HOSPITAL_COMMUNITY): Payer: Self-pay

## 2020-10-19 NOTE — Telephone Encounter (Signed)
Called to Discuss with patient about Covid symptoms and the use of the monoclonal antibody infusion for those with mild to moderate Covid symptoms and at a high risk of hospitalization.     Pt appears to qualify for this infusion due to co-morbid conditions and/or a member of an at-risk group in accordance with the FDA Emergency Use Authorization.    Pt stated her symptoms started on 12/8, she tested positive on 12/10 at [facility], c/o no taste/smell, congestion, cough, body aches, but states today she is feeling 100% better. Pt declines treatment at this time, will call if she has worsening symptoms.

## 2021-02-24 NOTE — Progress Notes (Deleted)
45 y.o. G37P0004 Married [place] American female here for annual exam.      No LMP recorded.            Sexually active: {yes no:314532}  The current method of family planning is {contraception:315051}.    Exercising: {yes no:314532}  {types:19826} Smoker:  {YES [id number]  Health Maintenance: Pap: 11-21-13 neg, 01-16-18 neg History of abnormal Pap:  {YES NO:22349} MMG:  3-8-201 bilateral & left breast u/s category b density birads 2:neg Colonoscopy:  none BMD:   none Gardasil:   *** Covid-19: *** Hep C testing: *** Screening Labs: ***   reports that she has been smoking cigarettes. She has been smoking about 0.50 packs per day. She has never used smokeless tobacco. She reports current alcohol use. She reports that she does not use drugs.  Past Medical History:  Diagnosis Date  . Atypical chest pain 02/09/2016  . Palpitations 02/09/2016    Past Surgical History:  Procedure Laterality Date  . BREAST REDUCTION SURGERY    . BREAST SURGERY    . TUBAL LIGATION      Current Outpatient Medications  Medication Sig Dispense Refill  . ibuprofen (ADVIL,MOTRIN) 600 MG tablet Take 1 tablet (600 mg total) by mouth every 8 (eight) hours as needed. 60 tablet 1   No current facility-administered medications for this visit.    Family History  Problem Relation Age of Onset  . Hypertension Father   . Kidney disease Father   . Cancer Maternal Aunt   . Cancer Paternal Grandmother   . Heart attack Mother   . Stroke Mother   . Hyperthyroidism Mother     Review of Systems  Exam:   There were no vitals taken for this visit.     General appearance: alert, cooperative and appears stated age, no acute distress Head: Normocephalic, without obvious abnormality Neck: no adenopathy, thyroid {EXAM; THYROID:18604} Lungs: clear to auscultation bilaterally Breasts: {Exam; breast:13139::"normal appearance, no masses or tenderness"} Heart: regular rate and rhythm Abdomen: soft,  non-tender; no masses,  no organomegaly Extremities: extremities normal, no edema Skin: No rashes or lesions Lymph nodes: Cervical, supraclavicular, and axillary nodes normal. No abnormal inguinal nodes palpated Neurologic: Grossly normal   Pelvic: External genitalia:  no lesions              Urethra:  normal appearing urethra with no masses, tenderness or lesions              Bartholins and Skenes: normal                 Vagina: normal appearing vagina, appropriate for age, normal appearing discharge, no lesions              Cervix: neg cervical motion tenderness, no visible lesions             Bimanual Exam:   Uterus:  {exam; uterus:12215}              Adnexa: {exam; adnexa:12223}                 ***, CMA Chaperone was present for exam.  A:  Well Woman with normal exam  P:   Pap :  Mammogram:  Labs:  Medications:

## 2021-02-28 ENCOUNTER — Ambulatory Visit: Payer: BC Managed Care – PPO | Admitting: Nurse Practitioner

## 2021-02-28 ENCOUNTER — Other Ambulatory Visit: Payer: Self-pay

## 2021-02-28 ENCOUNTER — Encounter: Payer: Self-pay | Admitting: Nurse Practitioner

## 2021-02-28 ENCOUNTER — Ambulatory Visit (INDEPENDENT_AMBULATORY_CARE_PROVIDER_SITE_OTHER): Payer: BC Managed Care – PPO | Admitting: Nurse Practitioner

## 2021-02-28 ENCOUNTER — Other Ambulatory Visit (HOSPITAL_COMMUNITY)
Admission: RE | Admit: 2021-02-28 | Discharge: 2021-02-28 | Disposition: A | Discharge status: Home / Self Care | Payer: BC Managed Care – PPO | Source: Ambulatory Visit | Attending: Nurse Practitioner | Admitting: Nurse Practitioner

## 2021-02-28 VITALS — BP 124/82 | Ht 62.0 in | Wt 205.0 lb

## 2021-02-28 DIAGNOSIS — N946 Dysmenorrhea, unspecified: Secondary | ICD-10-CM

## 2021-02-28 DIAGNOSIS — N390 Urinary tract infection, site not specified: Secondary | ICD-10-CM | POA: Diagnosis not present

## 2021-02-28 DIAGNOSIS — Z01419 Encounter for gynecological examination (general) (routine) without abnormal findings: Secondary | ICD-10-CM | POA: Insufficient documentation

## 2021-02-28 DIAGNOSIS — N920 Excessive and frequent menstruation with regular cycle: Secondary | ICD-10-CM

## 2021-02-28 DIAGNOSIS — Z1329 Encounter for screening for other suspected endocrine disorder: Secondary | ICD-10-CM | POA: Diagnosis not present

## 2021-02-28 DIAGNOSIS — Z0000 Encounter for general adult medical examination without abnormal findings: Secondary | ICD-10-CM | POA: Diagnosis not present

## 2021-02-28 NOTE — Patient Instructions (Signed)
Health Maintenance, Female Adopting a healthy lifestyle and getting preventive care are important in promoting health and wellness. Ask your health care provider about:  The right schedule for you to have regular tests and exams.  Things you can do on your own to prevent diseases and keep yourself healthy. What should I know about diet, weight, and exercise? Eat a healthy diet  Eat a diet that includes plenty of vegetables, fruits, low-fat dairy products, and lean protein.  Do not eat a lot of foods that are high in solid fats, added sugars, or sodium.   Maintain a healthy weight Body mass index (BMI) is used to identify weight problems. It estimates body fat based on height and weight. Your health care provider can help determine your BMI and help you achieve or maintain a healthy weight. Get regular exercise Get regular exercise. This is one of the most important things you can do for your health. Most adults should:  Exercise for at least 150 minutes each week. The exercise should increase your heart rate and make you sweat (moderate-intensity exercise).  Do strengthening exercises at least twice a week. This is in addition to the moderate-intensity exercise.  Spend less time sitting. Even light physical activity can be beneficial. Watch cholesterol and blood lipids Have your blood tested for lipids and cholesterol at 43 years of age, then have this test every 5 years. Have your cholesterol levels checked more often if:  Your lipid or cholesterol levels are high.  You are older than 43 years of age.  You are at high risk for heart disease. What should I know about cancer screening? Depending on your health history and family history, you may need to have cancer screening at various ages. This may include screening for:  Breast cancer.  Cervical cancer.  Colorectal cancer.  Skin cancer.  Lung cancer. What should I know about heart disease, diabetes, and high blood  pressure? Blood pressure and heart disease  High blood pressure causes heart disease and increases the risk of stroke. This is more likely to develop in people who have high blood pressure readings, are of African descent, or are overweight.  Have your blood pressure checked: ? Every 3-5 years if you are 18-39 years of age. ? Every year if you are 40 years old or older. Diabetes Have regular diabetes screenings. This checks your fasting blood sugar level. Have the screening done:  Once every three years after age 40 if you are at a normal weight and have a low risk for diabetes.  More often and at a younger age if you are overweight or have a high risk for diabetes. What should I know about preventing infection? Hepatitis B If you have a higher risk for hepatitis B, you should be screened for this virus. Talk with your health care provider to find out if you are at risk for hepatitis B infection. Hepatitis C Testing is recommended for:  Everyone born from 1945 through 1965.  Anyone with known risk factors for hepatitis C. Sexually transmitted infections (STIs)  Get screened for STIs, including gonorrhea and chlamydia, if: ? You are sexually active and are younger than 43 years of age. ? You are older than 43 years of age and your health care provider tells you that you are at risk for this type of infection. ? Your sexual activity has changed since you were last screened, and you are at increased risk for chlamydia or gonorrhea. Ask your health care provider   if you are at risk.  Ask your health care provider about whether you are at high risk for HIV. Your health care provider may recommend a prescription medicine to help prevent HIV infection. If you choose to take medicine to prevent HIV, you should first get tested for HIV. You should then be tested every 3 months for as long as you are taking the medicine. Pregnancy  If you are about to stop having your period (premenopausal) and  you may become pregnant, seek counseling before you get pregnant.  Take 400 to 800 micrograms (mcg) of folic acid every day if you become pregnant.  Ask for birth control (contraception) if you want to prevent pregnancy. Osteoporosis and menopause Osteoporosis is a disease in which the bones lose minerals and strength with aging. This can result in bone fractures. If you are 65 years old or older, or if you are at risk for osteoporosis and fractures, ask your health care provider if you should:  Be screened for bone loss.  Take a calcium or vitamin D supplement to lower your risk of fractures.  Be given hormone replacement therapy (HRT) to treat symptoms of menopause. Follow these instructions at home: Lifestyle  Do not use any products that contain nicotine or tobacco, such as cigarettes, e-cigarettes, and chewing tobacco. If you need help quitting, ask your health care provider.  Do not use street drugs.  Do not share needles.  Ask your health care provider for help if you need support or information about quitting drugs. Alcohol use  Do not drink alcohol if: ? Your health care provider tells you not to drink. ? You are pregnant, may be pregnant, or are planning to become pregnant.  If you drink alcohol: ? Limit how much you use to 0-1 drink a day. ? Limit intake if you are breastfeeding.  Be aware of how much alcohol is in your drink. In the [place] one drink equals one 12 oz bottle of beer (355 mL), one 5 oz glass of wine (148 mL), or one 1 oz glass of hard liquor (44 mL). General instructions  Schedule regular health, dental, and eye exams.  Stay current with your vaccines.  Tell your health care provider if: ? You often feel depressed. ? You have ever been abused or do not feel safe at home. Summary  Adopting a healthy lifestyle and getting preventive care are important in promoting health and wellness.  Follow your health care provider's instructions about healthy  diet, exercising, and getting tested or screened for diseases.  Follow your health care provider's instructions on monitoring your cholesterol and blood pressure. This information is not intended to replace advice given to you by your health care provider. Make sure you discuss any questions you have with your health care provider. Document Revised: 10/16/2018 Document Reviewed: 10/16/2018 Elsevier Patient Education  2021 Elsevier Inc.  

## 2021-02-28 NOTE — Progress Notes (Signed)
Sharon Bryant [birth year] [id number]   History:  45 y.o. G4P0004 presents for annual exam. Having cycles every 22 days. This has been ongoing for a couple of years but bleeding has become heavy with severe dysmenorrhea. BTL. Cryosurgery >15 years ago, subsequent paps normal. Lab work done elsewhere today for annual visit with PCP scheduled for this week.  Gynecologic History Patient's last menstrual period was 02/23/2021. Period Cycle (Days): 22 Period Duration (Days): 5 Period Pattern: Regular Menstrual Flow: Heavy Dysmenorrhea: (!) Severe Dysmenorrhea Symptoms: Cramping Contraception/Family planning: tubal ligation  Health Maintenance Last Pap: 01/16/2018. Results were: normal Last mammogram: 01/12/2020. Results were: left breast abscess, negative for malignancy Last colonoscopy:N/A Last Dexa: N/A  Past medical history, past surgical history, family history and social history were all reviewed and documented in the EPIC chart. Married. 4 sons ages 59-25.  ROS:  A ROS was performed and pertinent positives and negatives are included.  Exam:  Vitals:   02/28/21 1435  BP: 124/82  Weight: 205 lb (93 kg)  Height: 5\' 2"  (1.575 m)   Body mass index is 37.49 kg/m.  General appearance:  Normal Thyroid:  Symmetrical, normal in size, without palpable masses or nodularity. Respiratory  Auscultation:  Clear without wheezing or rhonchi Cardiovascular  Auscultation:  Regular rate, without rubs, murmurs or gallops  Edema/varicosities:  Not grossly evident Abdominal  Soft,nontender, without masses, guarding or rebound.  Liver/spleen:  No organomegaly noted  Hernia:  None appreciated  Skin  Inspection:  Grossly normal Breasts: Examined lying and sitting.   Right: Without masses, retractions, nipple discharge or axillary adenopathy.   Left: Without masses, retractions, nipple discharge or axillary adenopathy. Gentitourinary   Inguinal/mons:  Normal without inguinal  adenopathy  External genitalia:  Normal appearing vulva with no masses, tenderness, or lesions  BUS/Urethra/Skene's glands:  Normal  Vagina:  Normal appearing with normal color and discharge, no lesions  Cervix:  Normal appearing without discharge or lesions. Light bleeding from menses.   Uterus:  Normal in size, shape and contour.  Midline and mobile, nontender  Adnexa/parametria:     Rt: Normal in size, without masses or tenderness.   Lt: Normal in size, without masses or tenderness.  Anus and perineum: Normal  Assessment/Plan:  45 y.o. 47 for annual exam.   Well female exam with routine gynecological exam - Plan: Cytology - PAP( [facility]). Education provided on SBEs, importance of preventative screenings, current guidelines, high calcium diet, regular exercise, and multivitamin daily. Labs done elsewhere today for annual visit with PCP on Friday. She will have these faxed to our office. If TSH not done we will have her come back for this, but she thinks it was. Mother with history of thyroid disease.   Unusually frequent menses - Plan: Sunday PELVIS TRANSVAGINAL NON-OB (TV ONLY). Menses have become more frequent the last couple of years occurring every 22 days with heavy bleeding with clots. Cycles last 5 days. If ultrasound normal she is not interested in hormonal methods for cycle regulation and would consider an ablation.   Dysmenorrhea - Plan: US PELVIS TRANSVAGINAL NON-OB (TV ONLY). Complains of severe dysmenorrhea with heavy bleeding each cycle.    Screening for cervical cancer - Cryosurgery >15 years ago, subsequent paps normal. Pap with HR HPV today.   Screening for breast cancer - Normal mammogram history.  Continue annual screenings.  Normal breast exam today.  Return in 1 year for annual.      [place] DNP, 4:55 PM 02/28/2021

## 2021-03-03 LAB — CYTOLOGY - PAP
Comment: NEGATIVE
Diagnosis: NEGATIVE
High risk HPV: NEGATIVE

## 2021-03-04 DIAGNOSIS — R5383 Other fatigue: Secondary | ICD-10-CM | POA: Diagnosis not present

## 2021-03-04 DIAGNOSIS — Z0000 Encounter for general adult medical examination without abnormal findings: Secondary | ICD-10-CM | POA: Diagnosis not present

## 2021-03-10 ENCOUNTER — Ambulatory Visit: Payer: BC Managed Care – PPO | Admitting: Obstetrics & Gynecology

## 2021-03-10 ENCOUNTER — Other Ambulatory Visit: Payer: Self-pay

## 2021-03-10 ENCOUNTER — Ambulatory Visit (INDEPENDENT_AMBULATORY_CARE_PROVIDER_SITE_OTHER): Payer: BC Managed Care – PPO

## 2021-03-10 ENCOUNTER — Encounter: Payer: Self-pay | Admitting: Obstetrics & Gynecology

## 2021-03-10 VITALS — BP 120/72

## 2021-03-10 DIAGNOSIS — N946 Dysmenorrhea, unspecified: Secondary | ICD-10-CM | POA: Diagnosis not present

## 2021-03-10 DIAGNOSIS — N920 Excessive and frequent menstruation with regular cycle: Secondary | ICD-10-CM | POA: Diagnosis not present

## 2021-03-10 MED ORDER — NORETHINDRONE 0.35 MG PO TABS: 1.0000 | ORAL_TABLET | Freq: Every day | ORAL | 4 refills | Status: DC

## 2021-03-10 NOTE — Progress Notes (Signed)
    Sharon Bryant Delford Field [birth year] [id number]        45 y.o.  G4P4L4  RP: Menorrhagia/Dysmenorrhea for Pelvic US  HPI: Having cycles every 22 days. This has been ongoing for a couple of years but bleeding has become heavy with severe dysmenorrhea.  Using Advil, but not sufficient.  BTL. Per patient, recent Hb and TSH normal, will obtain reports.   OB History  Gravida Para Term Preterm AB Living  4 4     0 4  SAB IAB Ectopic Multiple Live Births      0        # Outcome Date GA Lbr Len/2nd Weight Sex Delivery Anes PTL Lv  4 Para           3 Para           2 Para           1 Para             Past medical history,surgical history, problem list, medications, allergies, family history and social history were all reviewed and documented in the EPIC chart.   Directed ROS with pertinent positives and negatives documented in the history of present illness/assessment and plan.  Exam:  Vitals:   03/10/21 1439  BP: 120/72   General appearance:  Normal  Pelvic US today: T/V images.  Anteverted uterus normal in size and shape with no myometrial mass.  The uterus is measured at 11.65 x 5.95 x 6.1 cm.  The endometrial lining is symmetrical with no mass or thickening seen.  The endometrial lining is measured at 10.36 mm, on day #16 of the cycle.  Both ovaries are normal in size with normal follicular pattern.  No adnexal mass seen.  No free fluid in the posterior cul-de-sac.   Assessment/Plan:  45 y.o. G4P0004   1. Menorrhagia with regular cycle Pelvic ultrasound findings thoroughly reviewed with patient.  Normal uterus and endometrial lining.  Normal ovaries.  Patient reassured.  Recent hemoglobin and TSH normal per patient, will obtain reports.  Currently using Advil without complete control of the heavy bleeding and dysmenorrhea.  Counseling on management done.  Progestin treatments thoroughly discussed including the progestin pill, the progesterone IUD, the Depo-Provera injections and  the Nexplanon.  Decision to start on the progestin only pill.  Risks, benefits and usage thoroughly reviewed with patient.  Prescription sent to pharmacy.  Patient will follow up for further counseling on management if no improvement at 3 months on the progestin pill.  2. Dysmenorrhea Severe secondary dysmenorrhea not controlled with Advil currently.  Adding the progestin pill.  Other orders - norethindrone (MICRONOR) 0.35 MG tablet; Take 1 tablet (0.35 mg total) by mouth daily.  Genia Del MD, 2:57 PM 03/10/2021

## 2021-03-28 DIAGNOSIS — F419 Anxiety disorder, unspecified: Secondary | ICD-10-CM | POA: Diagnosis not present

## 2021-03-28 DIAGNOSIS — F322 Major depressive disorder, single episode, severe without psychotic features: Secondary | ICD-10-CM | POA: Diagnosis not present

## 2021-04-25 DIAGNOSIS — F322 Major depressive disorder, single episode, severe without psychotic features: Secondary | ICD-10-CM | POA: Diagnosis not present

## 2021-04-25 DIAGNOSIS — F419 Anxiety disorder, unspecified: Secondary | ICD-10-CM | POA: Diagnosis not present

## 2021-05-16 ENCOUNTER — Encounter: Payer: Self-pay | Admitting: Obstetrics & Gynecology

## 2021-05-16 ENCOUNTER — Ambulatory Visit: Payer: BC Managed Care – PPO | Admitting: Obstetrics & Gynecology

## 2021-05-16 ENCOUNTER — Other Ambulatory Visit: Payer: Self-pay

## 2021-05-16 VITALS — BP 140/82 | HR 90 | Resp 14 | Ht 62.0 in | Wt 208.0 lb

## 2021-05-16 DIAGNOSIS — N920 Excessive and frequent menstruation with regular cycle: Secondary | ICD-10-CM | POA: Diagnosis not present

## 2021-05-16 DIAGNOSIS — N946 Dysmenorrhea, unspecified: Secondary | ICD-10-CM

## 2021-05-16 MED ORDER — MEDROXYPROGESTERONE ACETATE 150 MG/ML IM SUSP
150.0000 mg | Freq: Once | INTRAMUSCULAR | Status: AC
  Administered 2021-05-16: 150 mg via INTRAMUSCULAR

## 2021-05-16 MED ORDER — MEDROXYPROGESTERONE ACETATE 150 MG/ML IM SUSP
150.0000 mg | INTRAMUSCULAR | 4 refills | Status: DC
Start: 2021-05-16 — End: 2022-03-22

## 2021-05-16 NOTE — Progress Notes (Signed)
    Sharon Bryant Sharon Bryant 1976/01/18 [id number]        45 y.o.  G4P0004  S/P Tubal Ligation.  RP: Persistent severe dysmenorrhea/menorrhagia on the Progestin-only Pill  HPI: Persistent severe dysmenorrhea/menorrhagia on the Progestin-only Pill x 03/2021.  No pelvic pain outside the menstrual period.  No vaginal d/c.  Pelvic US in 03/2021 showed no Fibroids and a normal endometrium.   OB History  Gravida Para Term Preterm AB Living  4 4     0 4  SAB IAB Ectopic Multiple Live Births      0        # Outcome Date GA Lbr Len/2nd Weight Sex Delivery Anes PTL Lv  4 Para           3 Para           2 Para           1 Para             Past medical history,surgical history, problem list, medications, allergies, family history and social history were all reviewed and documented in the EPIC chart.   Directed ROS with pertinent positives and negatives documented in the history of present illness/assessment and plan.  Exam:  Vitals:   05/16/21 1335  BP: 140/82  Pulse: 90  Resp: 14  Weight: 208 lb (94.3 kg)  Height: 5\' 2"  (1.575 m)   General appearance:  Normal   Pelvic 03/10/2021: T/V images.  Anteverted uterus normal in size and shape with no myometrial mass.  The uterus is measured at 11.65 x 5.95 x 6.1 cm.  The endometrial lining is symmetrical with no mass or thickening seen.  The endometrial lining is measured at 10.36 mm, on day #16 of the cycle.  Both ovaries are normal in size with normal follicular pattern.  No adnexal mass seen.  No free fluid in the posterior cul-de-sac.      Assessment/Plan:  45 y.o. G4P0004   1. Dysmenorrhea Persistent severe dysmenorrhea with menorrhagia on the progestin only birth control pill.  Pelvic ultrasound Mar 10, 2021 was normal.  Alternatives to control the menorrhagia and dysmenorrhea thoroughly discussed with patient.  After counseling on the risks and benefits of each hormonal therapy, decision to start on the Depo-Provera injections.   Specific usage, risks and benefits reviewed.  Depo-Provera 150 mg IM every 3 months.  First injection of Depo-Provera given today.  Prescription sent to pharmacy.  Patient declines surgical approaches at this time.  2. Menorrhagia with regular cycle As above.  Other orders - medroxyPROGESTERone (DEPO-PROVERA) injection 150 mg - medroxyPROGESTERone (DEPO-PROVERA) 150 MG/ML injection; Inject 1 mL (150 mg total) into the muscle every 3 (three) months.   Mar 12, 2021 MD, 2:20 PM 05/16/2021

## 2021-05-24 ENCOUNTER — Ambulatory Visit
Admission: EM | Admit: 2021-05-24 | Discharge: 2021-05-24 | Disposition: A | Discharge status: Home / Self Care | Payer: BC Managed Care – PPO | Attending: Emergency Medicine | Admitting: Emergency Medicine

## 2021-05-24 ENCOUNTER — Encounter: Payer: Self-pay | Admitting: Emergency Medicine

## 2021-05-24 ENCOUNTER — Other Ambulatory Visit: Payer: Self-pay

## 2021-05-24 DIAGNOSIS — L0100 Impetigo, unspecified: Secondary | ICD-10-CM

## 2021-05-24 MED ORDER — CEPHALEXIN 500 MG PO CAPS: 1000.0000 mg | ORAL_CAPSULE | Freq: Two times a day (BID) | ORAL | 0 refills | Status: DC

## 2021-05-24 MED ORDER — CEPHALEXIN 500 MG PO CAPS: 1000.0000 mg | ORAL_CAPSULE | Freq: Two times a day (BID) | ORAL | 0 refills | Status: AC

## 2021-05-24 MED ORDER — VALACYCLOVIR HCL 1 G PO TABS: 1000.0000 mg | ORAL_TABLET | Freq: Two times a day (BID) | ORAL | 0 refills | Status: AC

## 2021-05-24 MED ORDER — MUPIROCIN 2 % EX OINT: 1.0000 "application " | TOPICAL_OINTMENT | Freq: Three times a day (TID) | CUTANEOUS | 0 refills | Status: DC

## 2021-05-24 NOTE — Discharge Instructions (Addendum)
This looks like a secondarily infected cold sore.  The Valtrex will help with the intensity and duration of the lesion, Bactroban and Keflex will treat the infection.  Warm compresses.

## 2021-05-24 NOTE — ED Provider Notes (Signed)
HPI  SUBJECTIVE:  Sharon Bryant is a 45 y.o. female who presents with an erythematous, tender lesion on her upper lip for the past 2 days with some upper lip swelling and crusting.  No contacts with similar symptoms.  No purulent drainage, body aches, nausea, vomiting, fevers, tongue or throat swelling, wheezing, shortness of breath, abdominal pain, diarrhea, urticaria, syncope, palpitations.  No trauma to this area.  No preceding paresthesias.  Similar symptoms 20 years ago but does not know what the problem was.  She has tried Abreva without improvement in her symptoms.  No aggravating factors.  She has a past medical history of boils.  No history of MRSA, HSV, cold sores, anaphylaxis, diabetes, hypertension.  LMP: Second week of June.  PMD: [employer].    Past Medical History:  Diagnosis Date   Atypical chest pain 02/09/2016   Palpitations 02/09/2016    Past Surgical History:  Procedure Laterality Date   BREAST REDUCTION SURGERY     BREAST SURGERY     TUBAL LIGATION      Family History  Problem Relation Age of Onset   Hypertension Father    Kidney disease Father    Cancer Maternal Aunt    Cancer Paternal Grandmother    Heart attack Mother    Stroke Mother    Hyperthyroidism Mother     Social History   Tobacco Use   Smoking status: Some Days    Packs/day: 0.50    Types: Cigarettes    Last attempt to quit: 07/19/2017    Years since quitting: 3.8   Smokeless tobacco: Never  Vaping Use   Vaping Use: Never used  Substance Use Topics   Alcohol use: Yes    Comment: occassionally   Drug use: No    No current facility-administered medications for this encounter.  Current Outpatient Medications:    mupirocin ointment (BACTROBAN) 2 %, Apply 1 application topically 3 (three) times daily., Disp: 22 g, Rfl: 0   valACYclovir (VALTREX) 1000 MG tablet, Take 1 tablet (1,000 mg total) by mouth 2 (two) times daily for 10 days., Disp: 20 tablet, Rfl: 0    cephALEXin (KEFLEX) 500 MG capsule, Take 2 capsules (1,000 mg total) by mouth 2 (two) times daily for 7 days., Disp: 28 capsule, Rfl: 0   ibuprofen (ADVIL,MOTRIN) 600 MG tablet, Take 1 tablet (600 mg total) by mouth every 8 (eight) hours as needed., Disp: 60 tablet, Rfl: 1   medroxyPROGESTERone (DEPO-PROVERA) 150 MG/ML injection, Inject 1 mL (150 mg total) into the muscle every 3 (three) months., Disp: 1 mL, Rfl: 4  No Known Allergies   ROS  As noted in HPI.   Physical Exam  BP 112/76   Pulse 88   Temp 98.8 F (37.1 C) (Oral)   Resp 20   LMP 05/09/2021   SpO2 100%   Constitutional: Well developed, well nourished, no acute distress Eyes:  EOMI, conjunctiva normal bilaterally HENT: Normocephalic, atraumatic,mucus membranes moist.  Positive upper lip swelling Respiratory: Normal inspiratory effort Cardiovascular: Normal rate GI: nondistended skin: Honey crusted lesion upper lip.  Vesicular rash versus pustular lesion underneath the crust.         Lymph: Positive tender submental, cervical lymphadenopathy Musculoskeletal: no deformities Neurologic: Alert & oriented x 3, no focal neuro deficits Psychiatric: Speech and behavior appropriate   ED Course   Medications - No data to display  No orders of the defined types were placed in this encounter.   No results found  for this or any previous visit (from the past 24 hour(s)). No results found.  ED Clinical Impression  1. Impetigo      ED Assessment/Plan  Difficult to tell if this is an impetigo versus an infected cold sore.  Will treat as both.  Warm compresses, Valtrex, Bactroban 2% ointment 3 times a day for 5 days, Keflex 1000 mg p.o. twice daily for 7 days.  Follow-up with PMD.  ER return precautions given.  Discussed  MDM, treatment plan, and plan for follow-up with patient.  patient agrees with plan.   Meds ordered this encounter  Medications   valACYclovir (VALTREX) 1000 MG tablet    Sig: Take 1  tablet (1,000 mg total) by mouth 2 (two) times daily for 10 days.    Dispense:  20 tablet    Refill:  0   mupirocin ointment (BACTROBAN) 2 %    Sig: Apply 1 application topically 3 (three) times daily.    Dispense:  22 g    Refill:  0   DISCONTD: cephALEXin (KEFLEX) 500 MG capsule    Sig: Take 2 capsules (1,000 mg total) by mouth 2 (two) times daily for 7 days.    Dispense:  20 capsule    Refill:  0   cephALEXin (KEFLEX) 500 MG capsule    Sig: Take 2 capsules (1,000 mg total) by mouth 2 (two) times daily for 7 days.    Dispense:  28 capsule    Refill:  0      *This clinic note was created using Scientist, clinical (histocompatibility and immunogenetics). Therefore, there may be occasional mistakes despite careful proofreading.  ?    Domenick Gong, MD 05/26/21 [phone]

## 2021-05-24 NOTE — ED Triage Notes (Signed)
Pt here with lesion above upper lip and lip swelling since Sunday and swollen lymph nodes since Sunday. Nothing like this has happened before and nothing is really helping. Does deal with frequent abscesses, but this does not look like that.

## 2021-07-28 NOTE — Progress Notes (Signed)
Patient is here for Depo Provera Injection Patient is within Depo Provera Calender Limits yes, last given 05-16-21 Next Depo Due between: 12/13 to 12/27 Last AEX: 02-28-21 TW AEX Scheduled: not scheduled.   Patient is aware when next depo is due  Pt tolerated Injection well in LUOQ.  Routed to provider for review, encounter closed.

## 2021-08-02 ENCOUNTER — Other Ambulatory Visit: Payer: Self-pay

## 2021-08-02 ENCOUNTER — Ambulatory Visit (INDEPENDENT_AMBULATORY_CARE_PROVIDER_SITE_OTHER): Payer: BC Managed Care – PPO | Admitting: *Deleted

## 2021-08-02 VITALS — BP 120/62 | HR 72 | Resp 14 | Ht 62.0 in | Wt 204.0 lb

## 2021-08-02 DIAGNOSIS — Z3042 Encounter for surveillance of injectable contraceptive: Secondary | ICD-10-CM | POA: Diagnosis not present

## 2021-08-02 MED ORDER — MEDROXYPROGESTERONE ACETATE 150 MG/ML IM SUSP
150.0000 mg | Freq: Once | INTRAMUSCULAR | Status: AC
  Administered 2021-08-02: 150 mg via INTRAMUSCULAR

## 2021-08-16 ENCOUNTER — Telehealth: Payer: Self-pay | Admitting: *Deleted

## 2021-08-16 NOTE — Telephone Encounter (Signed)
Pt complains of spotting since depo injection 08/02/2021,spotting developed after 08/06/2021. Pt states she has never had spotting before. Patient has also experiences cramping severity 6. Vaginal odor, urinary frequency. Pt would like an appointment. [facility] appointment team aware to give patient a call

## 2021-08-19 ENCOUNTER — Other Ambulatory Visit: Payer: Self-pay

## 2021-08-19 ENCOUNTER — Encounter: Payer: Self-pay | Admitting: Obstetrics & Gynecology

## 2021-08-19 ENCOUNTER — Ambulatory Visit: Payer: BC Managed Care – PPO | Admitting: Obstetrics & Gynecology

## 2021-08-19 VITALS — BP 110/64 | HR 72 | Temp 99.1°F | Resp 16

## 2021-08-19 DIAGNOSIS — R350 Frequency of micturition: Secondary | ICD-10-CM

## 2021-08-19 DIAGNOSIS — N898 Other specified noninflammatory disorders of vagina: Secondary | ICD-10-CM | POA: Diagnosis not present

## 2021-08-19 LAB — TIQ-MISC

## 2021-08-19 LAB — WET PREP FOR TRICH, YEAST, CLUE

## 2021-08-19 MED ORDER — SULFAMETHOXAZOLE-TRIMETHOPRIM 800-160 MG PO TABS: 1.0000 | ORAL_TABLET | Freq: Two times a day (BID) | ORAL | 0 refills | Status: AC

## 2021-08-19 NOTE — Progress Notes (Signed)
    Sharon Bryant Delford Field 1976/07/16 [id number]        45 y.o.  G4P4L4   RP: Urinary frequency with odor  HPI: Urinary frequency with odor.  No pelvic pain.  On DepoProvera with mild BTB.  No fever.    OB History  Gravida Para Term Preterm AB Living  4 4     0 4  SAB IAB Ectopic Multiple Live Births      0        # Outcome Date GA Lbr Len/2nd Weight Sex Delivery Anes PTL Lv  4 Para           3 Para           2 Para           1 Para             Past medical history,surgical history, problem list, medications, allergies, family history and social history were all reviewed and documented in the EPIC chart.   Directed ROS with pertinent positives and negatives documented in the history of present illness/assessment and plan.  Exam:  Vitals:   08/19/21 0900  BP: 110/64  Pulse: 72  Resp: 16  Temp: 99.1 F (37.3 C)  TempSrc: Oral   General appearance:  Normal  Abdomen: Normal  Gynecologic exam: Vulva normal.  Speculum:  Cervix/Vagina normal.  Mild dark blood.  Wet prep done.  U/A: Yellow cloudy, Pro Neg, Nit Neg, WBC 0-5, RBC 40-60, Bacteria few.  Urine Culture pending Wet prep: Negative   Assessment/Plan:  45 y.o. G4P0004   1. Urinary frequency Probable acute cystitis.  Will treat with Bactrim DS 1 tab PO BID x 3 days.  Usage reviewed, prescription sent to pharmacy. - Urinalysis,Complete w/RFL Culture  2. Vaginal odor Wet prep Neg, patient reassured. - WET PREP FOR TRICH, YEAST, CLUE  Other orders - escitalopram (LEXAPRO) 10 MG tablet; 1 tablet - clonazePAM (KLONOPIN) 0.5 MG tablet; 1 tablet at bedtime and for stress as needed - sulfamethoxazole-trimethoprim (BACTRIM DS) 800-160 MG tablet; Take 1 tablet by mouth 2 (two) times daily for 3 days.   Genia Del MD, 9:26 AM 08/19/2021

## 2021-08-23 LAB — URINALYSIS, COMPLETE W/RFL CULTURE
Bilirubin Urine: NEGATIVE
Glucose, UA: NEGATIVE
Hyaline Cast: NONE SEEN /LPF
Ketones, ur: NEGATIVE
Nitrites, Initial: NEGATIVE
Protein, ur: NEGATIVE
Specific Gravity, Urine: 1.01 (ref 1.001–1.035)
pH: 7 (ref 5.0–8.0)

## 2021-08-23 LAB — URINE CULTURE
MICRO NUMBER:: 12504569
SPECIMEN QUALITY:: ADEQUATE

## 2021-08-23 LAB — PAT ID TIQ DOC: Test Affected: 395

## 2021-08-23 LAB — CULTURE INDICATED

## 2021-09-01 ENCOUNTER — Telehealth: Payer: Self-pay | Admitting: *Deleted

## 2021-09-01 MED ORDER — NORETHINDRONE ACET-ETHINYL EST 1-20 MG-MCG PO TABS: 1.0000 | ORAL_TABLET | Freq: Every day | ORAL | 0 refills | Status: DC

## 2021-09-01 NOTE — Telephone Encounter (Signed)
Dr.Lavoie replied "Add Junel 1/20 x 3 packs to control the BTB.  Will then stop and see if better on DepoProvera only."   Patient informed, Rx sent.

## 2021-09-01 NOTE — Telephone Encounter (Signed)
Patient called reports she has been bleeding since 08/06/21 was seen on 08/19/21 for UTI. Reports the bleeding is not heavy, but daily changes pads frequently at least 4-5 times per day. She received her 2nd depo provera injection 08/02/21.  Patient said the daily bleeding and wearing pads for 27 days now is causing so much irritation. She is using A&D ointment on her buttocks. She asked if something can be sent to help stop bleeding? Please advise

## 2021-10-18 ENCOUNTER — Ambulatory Visit (INDEPENDENT_AMBULATORY_CARE_PROVIDER_SITE_OTHER): Payer: BC Managed Care – PPO

## 2021-10-18 ENCOUNTER — Other Ambulatory Visit: Payer: Self-pay

## 2021-10-18 DIAGNOSIS — Z3042 Encounter for surveillance of injectable contraceptive: Secondary | ICD-10-CM | POA: Diagnosis not present

## 2021-10-18 MED ORDER — MEDROXYPROGESTERONE ACETATE 150 MG/ML IM SUSP
150.0000 mg | Freq: Once | INTRAMUSCULAR | Status: AC
  Administered 2021-10-18: 150 mg via INTRAMUSCULAR

## 2021-10-18 NOTE — Progress Notes (Signed)
Patient is here for Depo Provera Injection Patient is within Depo Provera Calender Limits last given 08/02/21 Next Depo Due between: 01/03/22-01/17/22 Last AEX: 02/28/21-TW AEX Scheduled: not scheduled   Patient is aware when next depo is due  Pt tolerated Injection well.  Routed to provider for review, encounter closed.

## 2021-10-25 ENCOUNTER — Other Ambulatory Visit: Payer: Self-pay

## 2021-10-25 ENCOUNTER — Ambulatory Visit
Admission: EM | Admit: 2021-10-25 | Discharge: 2021-10-25 | Disposition: A | Discharge status: Home / Self Care | Payer: BC Managed Care – PPO | Attending: Physician Assistant | Admitting: Physician Assistant

## 2021-10-25 ENCOUNTER — Encounter: Payer: Self-pay | Admitting: Emergency Medicine

## 2021-10-25 DIAGNOSIS — M545 Low back pain, unspecified: Secondary | ICD-10-CM | POA: Diagnosis not present

## 2021-10-25 MED ORDER — CYCLOBENZAPRINE HCL 10 MG PO TABS: 10.0000 mg | ORAL_TABLET | Freq: Two times a day (BID) | ORAL | 0 refills | Status: DC | PRN

## 2021-10-25 MED ORDER — KETOROLAC TROMETHAMINE 60 MG/2ML IM SOLN
60.0000 mg | Freq: Once | INTRAMUSCULAR | Status: AC
  Administered 2021-10-25: 17:00:00 60 mg via INTRAMUSCULAR

## 2021-10-25 MED ORDER — PREDNISONE 20 MG PO TABS: 40.0000 mg | ORAL_TABLET | Freq: Every day | ORAL | 0 refills | Status: AC

## 2021-10-25 NOTE — ED Provider Notes (Signed)
[facility]    CSN: [id number] Arrival date & time: 10/25/21  1625      History   Chief Complaint Chief Complaint  Patient presents with   Back Pain    HPI Sharon Bryant is a 45 y.o. female.   Patient here today for evaluation of low back pain that started today.  She is not sure if it is related to heavy lifting at work but states that pain started today and without any other injury.  She has not taken any medication for pain.  She reports that certain movements and positions make pain worse.  She denies any numbness or tingling.  She has not had any loss of bowel or bladder function.  The history is provided by the patient.  Back Pain Associated symptoms: no abdominal pain, no dysuria, no fever and no numbness    Past Medical History:  Diagnosis Date   Atypical chest pain 02/09/2016   Palpitations 02/09/2016    Patient Active Problem List   Diagnosis Date Noted   Palpitations 02/09/2016   Atypical chest pain 02/09/2016   Obesity 02/18/2015    Past Surgical History:  Procedure Laterality Date   BREAST REDUCTION SURGERY     BREAST SURGERY     TUBAL LIGATION      OB History     Gravida  4   Para  4   Term      Preterm      AB  0   Living  4      SAB      IAB      Ectopic  0   Multiple      Live Births               Home Medications    Prior to Admission medications   Medication Sig Start Date End Date Taking? Authorizing Provider  clonazePAM (KLONOPIN) 0.5 MG tablet 1 tablet at bedtime and for stress as needed 03/28/21  Yes [provider]  cyclobenzaprine (FLEXERIL) 10 MG tablet Take 1 tablet (10 mg total) by mouth 2 (two) times daily as needed for muscle spasms. 10/25/21  Yes Francene Finders, PA-C  escitalopram (LEXAPRO) 10 MG tablet 1 tablet 03/28/21  Yes [provider]  ibuprofen (ADVIL,MOTRIN) 600 MG tablet Take 1 tablet (600 mg total) by mouth every 8 (eight) hours as needed. 01/16/18  Yes  Huel Cote, NP  medroxyPROGESTERone (DEPO-PROVERA) 150 MG/ML injection Inject 1 mL (150 mg total) into the muscle every 3 (three) months. 05/16/21  Yes Princess Bruins, MD  norethindrone-ethinyl estradiol (LOESTRIN) 1-20 MG-MCG tablet Take 1 tablet by mouth daily. 09/01/21  Yes Princess Bruins, MD  predniSONE (DELTASONE) 20 MG tablet Take 2 tablets (40 mg total) by mouth daily with breakfast for 5 days. 10/25/21 10/30/21 Yes Francene Finders, PA-C    Family History Family History  Problem Relation Age of Onset   Hypertension Father    Kidney disease Father    Cancer Maternal Aunt    Cancer Paternal Grandmother    Heart attack Mother    Stroke Mother    Hyperthyroidism Mother     Social History Social History   Tobacco Use   Smoking status: Every Day    Packs/day: 0.50    Types: Cigarettes    Last attempt to quit: 07/19/2017    Years since quitting: 4.2   Smokeless tobacco: Never  Vaping Use   Vaping Use: Never used  Substance  Use Topics   Alcohol use: Yes    Comment: occassionally   Drug use: No     Allergies   Patient has no known allergies.   Review of Systems Review of Systems  Constitutional:  Negative for chills and fever.  Eyes:  Negative for discharge and redness.  Respiratory:  Negative for shortness of breath.   Gastrointestinal:  Negative for abdominal pain, nausea and vomiting.  Genitourinary:  Positive for vaginal bleeding and vaginal discharge. Negative for dysuria and hematuria.  Musculoskeletal:  Positive for back pain and myalgias.  Neurological:  Negative for numbness.    Physical Exam Triage Vital Signs ED Triage Vitals  Enc Vitals Group     BP 10/25/21 1638 123/77     Pulse Rate 10/25/21 1638 94     Resp --      Temp 10/25/21 1638 98.7 F (37.1 C)     Temp Source 10/25/21 1638 Oral     SpO2 10/25/21 1638 98 %     Weight 10/25/21 1640 205 lb (93 kg)     Height 10/25/21 1640 5\' 2"  (1.575 m)     Head Circumference --      Peak  Flow --      Pain Score 10/25/21 1640 10     Pain Loc --      Pain Edu? --      Excl. in GC? --    No data found.  Updated Vital Signs BP 123/77 (BP Location: Left Arm)    Pulse 94    Temp 98.7 F (37.1 C) (Oral)    Ht 5\' 2"  (1.575 m)    Wt 205 lb (93 kg)    SpO2 98%    BMI 37.49 kg/m       Physical Exam Vitals and nursing note reviewed.  Constitutional:      General: She is not in acute distress.    Appearance: Normal appearance. She is not ill-appearing.  HENT:     Head: Normocephalic and atraumatic.  Eyes:     Conjunctiva/sclera: Conjunctivae normal.  Cardiovascular:     Rate and Rhythm: Normal rate.  Pulmonary:     Effort: Pulmonary effort is normal.  Musculoskeletal:     Comments: No midline spine tenderness, diffuse tenderness across low back.  Patient appears uncomfortable  Neurological:     Mental Status: She is alert.  Psychiatric:        Mood and Affect: Mood normal.        Behavior: Behavior normal.        Thought Content: Thought content normal.     UC Treatments / Results  Labs (all labs ordered are listed, but only abnormal results are displayed) Labs Reviewed - No data to display  EKG   Radiology No results found.  Procedures Procedures (including critical care time)  Medications Ordered in UC Medications  ketorolac (TORADOL) injection 60 mg (60 mg Intramuscular Given 10/25/21 1700)    Initial Impression / Assessment and Plan / UC Course  I have reviewed the triage vital signs and the nursing notes.  Pertinent labs & imaging results that were available during my care of the patient were reviewed by me and considered in my medical decision making (see chart for details).    Suspect likely muscular strain and will treat with steroids as well as muscle relaxer.  Toradol injection administered in office today and advised to start steroid treatment in the morning.  Recommend follow-up if no gradual improvement or  if symptoms worsen  anyway.  Final Clinical Impressions(s) / UC Diagnoses   Final diagnoses:  Acute bilateral low back pain without sciatica   Discharge Instructions   None    ED Prescriptions     Medication Sig Dispense Auth. Provider   predniSONE (DELTASONE) 20 MG tablet Take 2 tablets (40 mg total) by mouth daily with breakfast for 5 days. 10 tablet Ewell Poe F, PA-C   cyclobenzaprine (FLEXERIL) 10 MG tablet Take 1 tablet (10 mg total) by mouth 2 (two) times daily as needed for muscle spasms. 20 tablet Francene Finders, PA-C      PDMP not reviewed this encounter.   Francene Finders, PA-C 10/25/21 1800

## 2021-10-25 NOTE — ED Triage Notes (Signed)
Patient c/o low back pain that started today.  Patient unsure is she lifted something the wrong way at work.  Denies any OTC pain meds.

## 2022-01-03 ENCOUNTER — Ambulatory Visit (INDEPENDENT_AMBULATORY_CARE_PROVIDER_SITE_OTHER): Payer: BC Managed Care – PPO | Admitting: *Deleted

## 2022-01-03 ENCOUNTER — Other Ambulatory Visit: Payer: Self-pay

## 2022-01-03 VITALS — BP 124/62 | HR 74 | Resp 12 | Ht 64.0 in | Wt 208.0 lb

## 2022-01-03 DIAGNOSIS — Z3042 Encounter for surveillance of injectable contraceptive: Secondary | ICD-10-CM | POA: Diagnosis not present

## 2022-01-03 MED ORDER — MEDROXYPROGESTERONE ACETATE 150 MG/ML IM SUSP
150.0000 mg | Freq: Once | INTRAMUSCULAR | Status: AC
  Administered 2022-01-03: 150 mg via INTRAMUSCULAR

## 2022-01-03 NOTE — Progress Notes (Signed)
Patient is here for Depo Provera Injection Patient is within Depo Provera Calender Limits yes, last given 10-18-21  Next Depo Due between: 5/16 to 5/30  Last AEX: 02-28-21 TW AEX Scheduled: not scheduled, patient advised to schedule   Patient is aware when next depo is due  Pt tolerated Injection well in LUOQ.  Routed to provider for review, encounter closed.

## 2022-03-22 ENCOUNTER — Encounter: Payer: Self-pay | Admitting: Obstetrics & Gynecology

## 2022-03-22 ENCOUNTER — Ambulatory Visit (INDEPENDENT_AMBULATORY_CARE_PROVIDER_SITE_OTHER): Payer: BC Managed Care – PPO | Admitting: Obstetrics & Gynecology

## 2022-03-22 VITALS — BP 110/70 | HR 70 | Resp 16 | Ht 62.5 in | Wt 205.0 lb

## 2022-03-22 DIAGNOSIS — N898 Other specified noninflammatory disorders of vagina: Secondary | ICD-10-CM

## 2022-03-22 DIAGNOSIS — Z3042 Encounter for surveillance of injectable contraceptive: Secondary | ICD-10-CM

## 2022-03-22 DIAGNOSIS — E6609 Other obesity due to excess calories: Secondary | ICD-10-CM | POA: Diagnosis not present

## 2022-03-22 DIAGNOSIS — Z01419 Encounter for gynecological examination (general) (routine) without abnormal findings: Secondary | ICD-10-CM | POA: Diagnosis not present

## 2022-03-22 DIAGNOSIS — Z6836 Body mass index (BMI) 36.0-36.9, adult: Secondary | ICD-10-CM

## 2022-03-22 MED ORDER — TINIDAZOLE 500 MG PO TABS: 1000.0000 mg | ORAL_TABLET | Freq: Two times a day (BID) | ORAL | 0 refills | Status: AC

## 2022-03-22 MED ORDER — MEDROXYPROGESTERONE ACETATE 150 MG/ML IM SUSP: 150.0000 mg | INTRAMUSCULAR | 4 refills | Status: DC

## 2022-03-22 MED ORDER — MEDROXYPROGESTERONE ACETATE 150 MG/ML IM SUSP
150.0000 mg | Freq: Once | INTRAMUSCULAR | Status: AC
  Administered 2022-03-22: 150 mg via INTRAMUSCULAR

## 2022-03-22 NOTE — Addendum Note (Signed)
Addended by: Genia Del on: 03/22/2022 04:38 PM ? ? Modules accepted: Orders ? ?

## 2022-03-22 NOTE — Progress Notes (Signed)
? ? ?Sharon Bryant February 09, 1976 [id number] ? ? ?History:    46 y.o. G4P4L4 stable partner. S/P TL. ? ?RP:  Established patient presenting for annual gyn exam  ? ?HPI: Well on DepoProvera.  Occasional BTB, but overall less bleeding and dysmenorrhea.  Not bleeding currently.  No pelvic pain.  Stable partner, no pain with IC. Pap 02/2021 Neg.  No h/o abnormal Pap.  Will repeat at 2-3 yrs.  C/O odor vaginally.  Breasts normal.  Mammo Neg in 2021.  [facility]  BMI 36.9. ? ?Past medical history,surgical history, family history and social history were all reviewed and documented in the EPIC chart. ? ?Gynecologic History ?No LMP recorded. Patient has had an injection. ? ?Obstetric History ?OB History  ?Gravida Para Term Preterm AB Living  ?4 4     0 4  ?SAB IAB Ectopic Multiple Live Births  ?    0      ?  ?# Outcome Date GA Lbr Len/2nd Weight Sex Delivery Anes PTL Lv  ?4 Para           ?3 Para           ?2 Para           ?1 Para           ? ? ?ROS: A ROS was performed and pertinent positives and negatives are included in the history. ?GENERAL: No fevers or chills. HEENT: No change in vision, no earache, sore throat or sinus congestion. NECK: No pain or stiffness. CARDIOVASCULAR: No chest pain or pressure. No palpitations. PULMONARY: No shortness of breath, cough or wheeze. GASTROINTESTINAL: No abdominal pain, nausea, vomiting or diarrhea, melena or bright red blood per rectum. GENITOURINARY: No urinary frequency, urgency, hesitancy or dysuria. MUSCULOSKELETAL: No joint or muscle pain, no back pain, no recent trauma. DERMATOLOGIC: No rash, no itching, no lesions. ENDOCRINE: No polyuria, polydipsia, no heat or cold intolerance. No recent change in weight. HEMATOLOGICAL: No anemia or easy bruising or bleeding. NEUROLOGIC: No headache, seizures, numbness, tingling or weakness. PSYCHIATRIC: No depression, no loss of interest in normal activity or change in sleep pattern.  ?  ?Exam: ? ?BP  110/70   Pulse 70   Resp 16   Ht 5' 2.5" (1.588 m)   Wt 205 lb (93 kg)   BMI 36.90 kg/m?  ? ?Body mass index is 36.9 kg/m?. ? ?General appearance : Well developed well nourished female. No acute distress ?HEENT: Eyes: no retinal hemorrhage or exudates,  Neck supple, trachea midline, no carotid bruits, no thyroidmegaly ?Lungs: Clear to auscultation, no rhonchi or wheezes, or rib retractions  ?Heart: Regular rate and rhythm, no murmurs or gallops ?Breast:Examined in sitting and supine position were symmetrical in appearance, no palpable masses or tenderness,  no skin retraction, no nipple inversion, no nipple discharge, no skin discoloration, no axillary or supraclavicular lymphadenopathy ?Abdomen: no palpable masses or tenderness, no rebound or guarding ?Extremities: no edema or skin discoloration or tenderness ? ?Pelvic: Vulva: Normal ?            Vagina: No gross lesions.  Mild discharge.  Wet prep done. ? Cervix: No gross lesions or discharge ? Uterus  AV, normal size, shape and consistency, non-tender and mobile ? Adnexa  Without masses or tenderness ? Anus: Normal ? ?Wet prep Negative ? ? ?Assessment/Plan:  46 y.o. female for annual exam  ? ?1. Well female exam with routine gynecological exam ?Well on DepoProvera.  Occasional BTB, but overall less bleeding and dysmenorrhea.  Not bleeding currently.  No pelvic pain.  Stable partner, no pain with IC. Pap 02/2021 Neg.  No h/o abnormal Pap.  Will repeat at 2-3 yrs.  C/O odor vaginally.  Breasts normal.  Mammo Neg in 2021.  [facility]  BMI 36.9. ? ?2. Encounter for surveillance of injectable contraceptive ?Well on DepoProvera.  No CI to continue.  Suggestion made to call for an early DepoProvera injection if experiencing prolonged BTB. ?- medroxyPROGESTERone (DEPO-PROVERA) injection 150 mg ? ?3. Vaginal odor ?H/O BV.  Wet prep Negative today.  Will send Tinidazole treatment x 1 as needed. ? ?4. Class 2 obesity due to excess  calories without serious comorbidity with body mass index (BMI) of 36.0 to 36.9 in adult ?Lower calorie/carb diet.  Increase fitness activities. ? ?Other orders ?- propranolol (INDERAL) 10 MG tablet; Take 10 mg by mouth 2 (two) times daily as needed. ?- medroxyPROGESTERone (DEPO-PROVERA) 150 MG/ML injection; Inject 1 mL (150 mg total) into the muscle every 3 (three) months.  ?- tinidazole (TINDAMAX) 500 MG tablet; Take 2 tablets (1,000 mg total) by mouth 2 (two) times daily for 2 days.  ? ?Genia Del MD, 3:11 PM 03/22/2022 ? ?  ?

## 2022-03-23 LAB — WET PREP FOR TRICH, YEAST, CLUE

## 2022-04-12 DIAGNOSIS — Z0000 Encounter for general adult medical examination without abnormal findings: Secondary | ICD-10-CM | POA: Diagnosis not present

## 2022-05-10 DIAGNOSIS — M79644 Pain in right finger(s): Secondary | ICD-10-CM | POA: Diagnosis not present

## 2022-05-10 DIAGNOSIS — M25531 Pain in right wrist: Secondary | ICD-10-CM | POA: Diagnosis not present

## 2022-05-10 DIAGNOSIS — M1811 Unilateral primary osteoarthritis of first carpometacarpal joint, right hand: Secondary | ICD-10-CM | POA: Diagnosis not present

## 2022-06-07 ENCOUNTER — Ambulatory Visit (INDEPENDENT_AMBULATORY_CARE_PROVIDER_SITE_OTHER): Payer: BC Managed Care – PPO | Admitting: *Deleted

## 2022-06-07 DIAGNOSIS — Z3042 Encounter for surveillance of injectable contraceptive: Secondary | ICD-10-CM | POA: Diagnosis not present

## 2022-06-07 MED ORDER — MEDROXYPROGESTERONE ACETATE 150 MG/ML IM SUSP
150.0000 mg | Freq: Once | INTRAMUSCULAR | Status: AC
  Administered 2022-06-07: 150 mg via INTRAMUSCULAR

## 2022-07-19 DIAGNOSIS — M654 Radial styloid tenosynovitis [de Quervain]: Secondary | ICD-10-CM | POA: Diagnosis not present

## 2022-07-19 DIAGNOSIS — M25532 Pain in left wrist: Secondary | ICD-10-CM | POA: Diagnosis not present

## 2022-08-11 ENCOUNTER — Telehealth: Payer: Self-pay

## 2022-08-11 NOTE — Telephone Encounter (Signed)
Patient called with complaints of vaginal bleeding. Onset x 1 month. Changes panty liner every hr.  Other symptoms include: headaches, vaginal discharge, vaginal odor. No pain.    Patient is requesting treatment to stop bleeding. I will route this to Provider for recommendations

## 2022-08-11 NOTE — Telephone Encounter (Signed)
Pt left VM on triage line re: persistent bleeding while on depo provera. Requesting a returned nurse call.

## 2022-08-11 NOTE — Telephone Encounter (Signed)
Ibuprofen 800mg  every 8 hours to slow the bleeding, if it persists or becomes heavier schedule for next week

## 2022-08-11 NOTE — Telephone Encounter (Signed)
Patient aware of recommendations.  

## 2022-08-23 ENCOUNTER — Ambulatory Visit (INDEPENDENT_AMBULATORY_CARE_PROVIDER_SITE_OTHER): Payer: BC Managed Care – PPO

## 2022-08-23 DIAGNOSIS — N920 Excessive and frequent menstruation with regular cycle: Secondary | ICD-10-CM | POA: Diagnosis not present

## 2022-08-23 MED ORDER — MEDROXYPROGESTERONE ACETATE 150 MG/ML IM SUSP
150.0000 mg | Freq: Once | INTRAMUSCULAR | Status: AC
  Administered 2022-08-23: 150 mg via INTRAMUSCULAR

## 2022-08-23 NOTE — Progress Notes (Signed)
Depo Provera injection 150mg  given IM LUOQ.  Patient tolerated injection well.  Patient states she called and spoke to Triage about irregular bleeding.  Patient tried Ibuprofen as directed and still has irregular bleeding.  Patient will schedule appointment to follow up with Dr. Dellis Filbert (patient wants to discuss treatment options).  Next injection is due 11/08/22-11/22/22.

## 2022-09-18 DIAGNOSIS — R058 Other specified cough: Secondary | ICD-10-CM | POA: Diagnosis not present

## 2022-09-18 DIAGNOSIS — R059 Cough, unspecified: Secondary | ICD-10-CM | POA: Diagnosis not present

## 2022-10-12 DIAGNOSIS — F419 Anxiety disorder, unspecified: Secondary | ICD-10-CM | POA: Diagnosis not present

## 2022-10-18 DIAGNOSIS — F419 Anxiety disorder, unspecified: Secondary | ICD-10-CM | POA: Diagnosis not present

## 2022-11-08 ENCOUNTER — Ambulatory Visit (INDEPENDENT_AMBULATORY_CARE_PROVIDER_SITE_OTHER): Payer: BC Managed Care – PPO

## 2022-11-08 DIAGNOSIS — Z3042 Encounter for surveillance of injectable contraceptive: Secondary | ICD-10-CM

## 2022-11-08 MED ORDER — MEDROXYPROGESTERONE ACETATE 150 MG/ML IM SUSP
150.0000 mg | Freq: Once | INTRAMUSCULAR | Status: AC
  Administered 2022-11-08: 150 mg via INTRAMUSCULAR

## 2022-11-08 NOTE — Progress Notes (Signed)
Depo Provera given IM LUOQ.  Patient tolerated injection well.  Next injection is due 01/25/23-02/08/23.  AEX 03/22/22.

## 2022-11-15 DIAGNOSIS — F419 Anxiety disorder, unspecified: Secondary | ICD-10-CM | POA: Diagnosis not present

## 2023-01-08 DIAGNOSIS — M654 Radial styloid tenosynovitis [de Quervain]: Secondary | ICD-10-CM | POA: Diagnosis not present

## 2023-01-08 DIAGNOSIS — M65312 Trigger thumb, left thumb: Secondary | ICD-10-CM | POA: Diagnosis not present

## 2023-01-26 ENCOUNTER — Ambulatory Visit (INDEPENDENT_AMBULATORY_CARE_PROVIDER_SITE_OTHER): Payer: BC Managed Care – PPO | Admitting: *Deleted

## 2023-01-26 DIAGNOSIS — Z3042 Encounter for surveillance of injectable contraceptive: Secondary | ICD-10-CM

## 2023-01-26 MED ORDER — MEDROXYPROGESTERONE ACETATE 150 MG/ML IM SUSP
150.0000 mg | Freq: Once | INTRAMUSCULAR | Status: AC
  Administered 2023-01-26: 150 mg via INTRAMUSCULAR

## 2023-02-06 ENCOUNTER — Ambulatory Visit: Payer: BC Managed Care – PPO

## 2023-03-30 ENCOUNTER — Encounter (HOSPITAL_COMMUNITY): Payer: Self-pay

## 2023-03-30 ENCOUNTER — Ambulatory Visit (INDEPENDENT_AMBULATORY_CARE_PROVIDER_SITE_OTHER): Payer: BC Managed Care – PPO

## 2023-03-30 ENCOUNTER — Ambulatory Visit (HOSPITAL_COMMUNITY)
Admission: EM | Admit: 2023-03-30 | Discharge: 2023-03-30 | Disposition: A | Discharge status: Home / Self Care | Payer: BC Managed Care – PPO | Attending: Internal Medicine | Admitting: Internal Medicine

## 2023-03-30 DIAGNOSIS — M25512 Pain in left shoulder: Secondary | ICD-10-CM

## 2023-03-30 DIAGNOSIS — M19012 Primary osteoarthritis, left shoulder: Secondary | ICD-10-CM | POA: Diagnosis not present

## 2023-03-30 MED ORDER — ACETAMINOPHEN 325 MG PO TABS
975.0000 mg | ORAL_TABLET | Freq: Once | ORAL | Status: AC
  Administered 2023-03-30: 975 mg via ORAL

## 2023-03-30 MED ORDER — ACETAMINOPHEN 325 MG PO TABS
ORAL_TABLET | ORAL | Status: AC
  Filled 2023-03-30: qty 3

## 2023-03-30 MED ORDER — KETOROLAC TROMETHAMINE 30 MG/ML IJ SOLN
30.0000 mg | Freq: Once | INTRAMUSCULAR | Status: AC
  Administered 2023-03-30: 30 mg via INTRAMUSCULAR

## 2023-03-30 MED ORDER — KETOROLAC TROMETHAMINE 30 MG/ML IJ SOLN
INTRAMUSCULAR | Status: AC
  Filled 2023-03-30: qty 1

## 2023-03-30 MED ORDER — METHOCARBAMOL 500 MG PO TABS: 500.0000 mg | ORAL_TABLET | Freq: Two times a day (BID) | ORAL | 0 refills | Status: DC

## 2023-03-30 NOTE — ED Triage Notes (Signed)
Triaged completed by Lyla Son, RN, who unable to get into EPIC to chart at this time.   Pt reports Wed woke up with pain in left shoulder and left upper arm. Yesterday pain got worse in left shoulder that radiates to left elbow. Pt states, "feels like it is out of socket but I having done anything for it to be". Tried ice, heat and Advil with no relief. Pt reports that she is unable to move arm due to pain. Denies numbness

## 2023-03-30 NOTE — Discharge Instructions (Addendum)
Your pain is likely due to a muscle strain which will improve on its own with time.   - Take ibuprofen 600mg with food every 6 hours as needed for pain and inflammation. Do not take any other NSAID containing medicine when taking ibuprofen.   - You may start taking ibuprofen tomorrow since you were given a dose of ketorolac injection in clinic today for pain and inflammation. - You may also take the prescribed muscle relaxer as directed as needed for muscle aches/spasm.  Do not take this medication and drive or drink alcohol as it can make you sleepy.  Mainly use this medicine at nighttime as needed. - Apply heat 20 minutes on then 20 minutes off and perform gentle range of motion exercises to the area of greatest pain to prevent muscle stiffness and provide further pain relief.   Red flag symptoms to watch out for are numbness/tingling to the legs, weakness, loss of bowel/bladder control, and/or worsening pain that does not respond well to medicines. Follow-up with your primary care provider or return to urgent care if your symptoms do not improve in the next 3 to 4 days with medications and interventions recommended today. If your symptoms are severe (red flag), please go to the emergency room.  I hope you feel better!  

## 2023-03-30 NOTE — ED Provider Notes (Signed)
[facility]    CSN: [id number] Arrival date & time: 03/30/23  1747      History   Chief Complaint Chief Complaint  Patient presents with   Shoulder Pain   Arm Pain    HPI Patiance Sharon Bryant is a 47 y.o. female.   Patient presents to urgent care for evaluation of left shoulder pain that started 2 days ago on Mar 28, 2023. Symptoms were mild and to the anterior left shoulder and she was able to move her arm actively. Symptoms have worsened significantly over the last 24 hours. Pain to the left shoulder is worse with movement. When she tries to move the left arm at the shoulder joint, this is when pain becomes severe. As long as left arm is still at the shoulder joint, she is not experiencing pain. She works as a Conservation officer, nature at [employer] but has not been to work since Monday and does not recall any trauma/injuries to the left arm/shoulder. She does state she had to sleep in a recliner at the hospital in the waiting room on Mar 28, 2023 when symptoms first began while she was visiting her mother in the hospital and is unsure if this is related to shoulder pain. She is unsure if she slept on the left shoulder wrong or what happened to cause the pain.  She has been using Advil, ice, and heat to the area without much relief. Last dose of Advil was greater than 8 hours ago.    Shoulder Pain Arm Pain    Past Medical History:  Diagnosis Date   Atypical chest pain 02/09/2016   Palpitations 02/09/2016    Patient Active Problem List   Diagnosis Date Noted   Palpitations 02/09/2016   Atypical chest pain 02/09/2016   Obesity 02/18/2015    Past Surgical History:  Procedure Laterality Date   BREAST REDUCTION SURGERY     BREAST SURGERY     TUBAL LIGATION      OB History     Gravida  4   Para  4   Term      Preterm      AB  0   Living  4      SAB      IAB      Ectopic  0   Multiple      Live Births               Home Medications     Prior to Admission medications   Medication Sig Start Date End Date Taking? Authorizing Provider  methocarbamol (ROBAXIN) 500 MG tablet Take 1 tablet (500 mg total) by mouth 2 (two) times daily. 03/30/23  Yes Carlisle Beers, FNP  clonazePAM (KLONOPIN) 0.5 MG tablet 1 tablet at bedtime and for stress as needed 03/28/21   [provider]  ibuprofen (ADVIL,MOTRIN) 600 MG tablet Take 1 tablet (600 mg total) by mouth every 8 (eight) hours as needed. 01/16/18   Harrington Challenger, NP  medroxyPROGESTERone (DEPO-PROVERA) 150 MG/ML injection Inject 1 mL (150 mg total) into the muscle every 3 (three) months. 03/22/22   Genia Del, MD  propranolol (INDERAL) 10 MG tablet Take 10 mg by mouth 2 (two) times daily as needed. 02/20/22   [provider]    Family History Family History  Problem Relation Age of Onset   Hypertension Father    Kidney disease Father    Cancer Maternal Aunt    Cancer Paternal Grandmother  Heart attack Mother    Stroke Mother    Hyperthyroidism Mother     Social History Social History   Tobacco Use   Smoking status: Every Day    Packs/day: .5    Types: Cigarettes    Last attempt to quit: 07/19/2017    Years since quitting: 5.7   Smokeless tobacco: Never   Tobacco comments:    Cigarettes & vaping (more vaping than smoking)  Vaping Use   Vaping Use: Never used  Substance Use Topics   Alcohol use: Yes    Comment: occassionally   Drug use: No     Allergies   Patient has no known allergies.   Review of Systems Review of Systems Per HPI  Physical Exam Triage Vital Signs ED Triage Vitals  Enc Vitals Group     BP 03/30/23 1813 116/80     Pulse Rate 03/30/23 1813 80     Resp 03/30/23 1813 18     Temp 03/30/23 1813 98.3 F (36.8 C)     Temp Source 03/30/23 1813 Oral     SpO2 03/30/23 1813 97 %     Weight --      Height --      Head Circumference --      Peak Flow --      Pain Score 03/30/23 1814 10     Pain Loc --       Pain Edu? --      Excl. in GC? --    No data found.  Updated Vital Signs BP 116/80 (BP Location: Right Arm)   Pulse 80   Temp 98.3 F (36.8 C) (Oral)   Resp 18   SpO2 97%   Visual Acuity Right Eye Distance:   Left Eye Distance:   Bilateral Distance:    Right Eye Near:   Left Eye Near:    Bilateral Near:     Physical Exam Vitals and nursing note reviewed.  Constitutional:      Appearance: She is not ill-appearing or toxic-appearing.  HENT:     Head: Normocephalic and atraumatic.     Right Ear: Hearing and external ear normal.     Left Ear: Hearing and external ear normal.     Nose: Nose normal.     Mouth/Throat:     Lips: Pink.  Eyes:     General: Lids are normal. Vision grossly intact. Gaze aligned appropriately.     Extraocular Movements: Extraocular movements intact.     Conjunctiva/sclera: Conjunctivae normal.  Cardiovascular:     Rate and Rhythm: Normal rate and regular rhythm.     Heart sounds: Normal heart sounds, S1 normal and S2 normal.  Pulmonary:     Effort: Pulmonary effort is normal. No respiratory distress.     Breath sounds: Normal breath sounds and air entry.  Musculoskeletal:     Right shoulder: Normal.     Left shoulder: Tenderness (TTP to the anterior left shoulder and multiple points over the left trapezius muscle of the neck) present. No swelling, deformity, effusion, laceration, bony tenderness or crepitus. Decreased range of motion (secondary to tenderness). Normal strength. Normal pulse (+2 left brachial pulse).     Right upper arm: Normal.     Left upper arm: Normal.       Arms:     Cervical back: Neck supple. Tenderness (tenderness over multiple points of the left trapezius muscle) present. No swelling, edema, deformity, erythema, signs of trauma, lacerations, rigidity, spasms, torticollis, bony tenderness or  crepitus. Pain with movement present. Normal range of motion.     Thoracic back: Normal.     Lumbar back: Normal.  Skin:     General: Skin is warm and dry.     Capillary Refill: Capillary refill takes less than 2 seconds.     Findings: No rash.  Neurological:     General: No focal deficit present.     Mental Status: She is alert and oriented to person, place, and time. Mental status is at baseline.     Cranial Nerves: No dysarthria or facial asymmetry.  Psychiatric:        Mood and Affect: Mood normal.        Speech: Speech normal.        Behavior: Behavior normal.        Thought Content: Thought content normal.        Judgment: Judgment normal.      UC Treatments / Results  Labs (all labs ordered are listed, but only abnormal results are displayed) Labs Reviewed - No data to display  EKG   Radiology DG Shoulder Left  Result Date: 03/30/2023 CLINICAL DATA:  Left shoulder pain for 2 days.  No known injury. EXAM: LEFT SHOULDER - 2+ VIEW COMPARISON:  None Available. FINDINGS: The glenohumeral joint space is maintained. Mild inferior glenoid and humeral head-neck junction degenerative osteophytosis. No significant acromioclavicular osteoarthritis. There is a 9 mm oval calcification overlying the posterior cortex of the humeral head at the expected region of the posterior infraspinatus or superior teres minor tendon insertion. No acute fracture is seen. No dislocation. IMPRESSION: Very mild glenohumeral osteoarthritis. Electronically Signed   By: Neita Garnet M.D.   On: 03/30/2023 18:51    Procedures Procedures (including critical care time)  Medications Ordered in UC Medications  acetaminophen (TYLENOL) tablet 975 mg (975 mg Oral Given 03/30/23 1921)  ketorolac (TORADOL) 30 MG/ML injection 30 mg (30 mg Intramuscular Given 03/30/23 1921)    Initial Impression / Assessment and Plan / UC Course  I have reviewed the triage vital signs and the nursing notes.  Pertinent labs & imaging results that were available during my care of the patient were reviewed by me and considered in my medical decision making  (see chart for details).   1. Acute pain of left shoulder Presentation is consistent with acute muscle strain of the left shoulder/left trapezius muscle that will likely resolve with rest, fluids, as needed use of ibuprofen and muscle relaxer, heat, and gentle range of motion exercises. May take ibuprofen 600mg  every 6 hours and robaxin muscle relaxer every 12 hours as needed for muscle spasm. Drowsiness precautions regarding muscle relaxer use discussed. No NSAID for 24 hours due to ketorolac 30mg  IM in clinic for pain and inflammation. Also given tylenol 975mg  in clinic for pain.  Heat and gentle ROM exercises discussed. X-ray of the left shoulder is negative for fracture/dislocation.  Walking referral given to orthopedic provider should symptoms fail to improve in the next 1-2 weeks.   Discussed physical exam and available lab work findings in clinic with patient.  Counseled patient regarding appropriate use of medications and potential side effects for all medications recommended or prescribed today. Discussed red flag signs and symptoms of worsening condition,when to call the PCP office, return to urgent care, and when to seek higher level of care in the emergency department. Patient verbalizes understanding and agreement with plan. All questions answered. Patient discharged in stable condition.    Final Clinical Impressions(s) /  UC Diagnoses   Final diagnoses:  Acute pain of left shoulder     Discharge Instructions      Your pain is likely due to a muscle strain which will improve on its own with time.   - Take ibuprofen 600mg  with food every 6 hours as needed for pain and inflammation. Do not take any other NSAID containing medicine when taking ibuprofen.   - You may start taking ibuprofen tomorrow since you were given a dose of ketorolac injection in clinic today for pain and inflammation. - You may also take the prescribed muscle relaxer as directed as needed for muscle aches/spasm.   Do not take this medication and drive or drink alcohol as it can make you sleepy.  Mainly use this medicine at nighttime as needed. - Apply heat 20 minutes on then 20 minutes off and perform gentle range of motion exercises to the area of greatest pain to prevent muscle stiffness and provide further pain relief.   Red flag symptoms to watch out for are numbness/tingling to the legs, weakness, loss of bowel/bladder control, and/or worsening pain that does not respond well to medicines. Follow-up with your primary care provider or return to urgent care if your symptoms do not improve in the next 3 to 4 days with medications and interventions recommended today. If your symptoms are severe (red flag), please go to the emergency room.  I hope you feel better!      ED Prescriptions     Medication Sig Dispense Auth. Provider   methocarbamol (ROBAXIN) 500 MG tablet Take 1 tablet (500 mg total) by mouth 2 (two) times daily. 20 tablet Carlisle Beers, FNP      PDMP not reviewed this encounter.   Carlisle Beers, [place] 03/31/23 [id number]

## 2023-04-13 ENCOUNTER — Ambulatory Visit (INDEPENDENT_AMBULATORY_CARE_PROVIDER_SITE_OTHER): Payer: BC Managed Care – PPO

## 2023-04-13 DIAGNOSIS — Z3042 Encounter for surveillance of injectable contraceptive: Secondary | ICD-10-CM | POA: Diagnosis not present

## 2023-04-13 MED ORDER — MEDROXYPROGESTERONE ACETATE 150 MG/ML IM SUSY
150.0000 mg | PREFILLED_SYRINGE | Freq: Once | INTRAMUSCULAR | Status: AC
Start: 2023-04-13 — End: 2023-04-13
  Administered 2023-04-13: 150 mg via INTRAMUSCULAR

## 2023-04-27 ENCOUNTER — Ambulatory Visit: Payer: BC Managed Care – PPO | Admitting: Obstetrics & Gynecology

## 2023-04-27 ENCOUNTER — Other Ambulatory Visit (HOSPITAL_COMMUNITY)
Admission: RE | Admit: 2023-04-27 | Discharge: 2023-04-27 | Disposition: A | Discharge status: Home / Self Care | Payer: BC Managed Care – PPO | Source: Ambulatory Visit | Attending: Obstetrics & Gynecology | Admitting: Obstetrics & Gynecology

## 2023-04-27 ENCOUNTER — Ambulatory Visit (INDEPENDENT_AMBULATORY_CARE_PROVIDER_SITE_OTHER): Payer: BC Managed Care – PPO | Admitting: Obstetrics & Gynecology

## 2023-04-27 ENCOUNTER — Encounter: Payer: Self-pay | Admitting: Obstetrics & Gynecology

## 2023-04-27 VITALS — BP 120/78 | HR 88 | Ht 63.25 in | Wt 206.0 lb

## 2023-04-27 DIAGNOSIS — Z01419 Encounter for gynecological examination (general) (routine) without abnormal findings: Secondary | ICD-10-CM | POA: Insufficient documentation

## 2023-04-27 DIAGNOSIS — R6882 Decreased libido: Secondary | ICD-10-CM | POA: Diagnosis not present

## 2023-04-27 DIAGNOSIS — F17200 Nicotine dependence, unspecified, uncomplicated: Secondary | ICD-10-CM

## 2023-04-27 DIAGNOSIS — Z3042 Encounter for surveillance of injectable contraceptive: Secondary | ICD-10-CM

## 2023-04-27 DIAGNOSIS — N946 Dysmenorrhea, unspecified: Secondary | ICD-10-CM | POA: Diagnosis not present

## 2023-04-27 DIAGNOSIS — Z9851 Tubal ligation status: Secondary | ICD-10-CM

## 2023-04-27 MED ORDER — MEDROXYPROGESTERONE ACETATE 150 MG/ML IM SUSP: 150.0000 mg | INTRAMUSCULAR | 4 refills | Status: AC

## 2023-04-27 NOTE — Progress Notes (Signed)
Sharon Bryant Delford Field [birth year] [id number]   History:    47 y.o.  G4P4L4 stable partner. S/P TL.   RP:  Established patient presenting for annual gyn exam    HPI: Well on DepoProvera.  Occasional BTB, but overall less bleeding and dysmenorrhea.  Not bleeding currently.  No pelvic pain.  Stable partner, no pain with IC using KY, but low libido.  Pap 02/2021 Neg.  No h/o abnormal Pap. Pap reflex today.  Breasts normal.  Mammo Neg in 01/2020.  [facility]  BMI 36.2.  Colono this year at age 64.  Recommend strongly to quit smoking.   Past medical history,surgical history, family history and social history were all reviewed and documented in the EPIC chart.  Gynecologic History No LMP recorded. Patient has had an injection.  Obstetric History OB History  Gravida Para Term Preterm AB Living  4 4     0 4  SAB IAB Ectopic Multiple Live Births      0        # Outcome Date GA Lbr Len/2nd Weight Sex Delivery Anes PTL Lv  4 Para           3 Para           2 Para           1 Para              ROS: A ROS was performed and pertinent positives and negatives are included in the history. GENERAL: No fevers or chills. HEENT: No change in vision, no earache, sore throat or sinus congestion. NECK: No pain or stiffness. CARDIOVASCULAR: No chest pain or pressure. No palpitations. PULMONARY: No shortness of breath, cough or wheeze. GASTROINTESTINAL: No abdominal pain, nausea, vomiting or diarrhea, melena or bright red blood per rectum. GENITOURINARY: No urinary frequency, urgency, hesitancy or dysuria. MUSCULOSKELETAL: No joint or muscle pain, no back pain, no recent trauma. DERMATOLOGIC: No rash, no itching, no lesions. ENDOCRINE: No polyuria, polydipsia, no heat or cold intolerance. No recent change in weight. HEMATOLOGICAL: No anemia or easy bruising or bleeding. NEUROLOGIC: No headache, seizures, numbness, tingling or weakness. PSYCHIATRIC: No depression, no loss of  interest in normal activity or change in sleep pattern.     Exam:   Pulse 88   Ht 5' 3.25" (1.607 m)   Wt 206 lb (93.4 kg)   SpO2 99%   BMI 36.20 kg/m   Body mass index is 36.2 kg/m.  General appearance : Well developed well nourished female. No acute distress HEENT: Eyes: no retinal hemorrhage or exudates,  Neck supple, trachea midline, no carotid bruits, no thyroidmegaly Lungs: Clear to auscultation, no rhonchi or wheezes, or rib retractions  Heart: Regular rate and rhythm, no murmurs or gallops Breast:Examined in sitting and supine position were symmetrical in appearance, no palpable masses or tenderness,  no skin retraction, no nipple inversion, no nipple discharge, no skin discoloration, no axillary or supraclavicular lymphadenopathy Abdomen: no palpable masses or tenderness, no rebound or guarding Extremities: no edema or skin discoloration or tenderness  Pelvic: Vulva: Normal             Vagina: No gross lesions or discharge  Cervix: No gross lesions or discharge.  Pap reflex done.  Uterus  AV, normal size, shape and consistency, non-tender and mobile  Adnexa  Without masses or tenderness  Anus: Normal   Assessment/Plan:  47 y.o. female for annual exam   1. Encounter  for routine gynecological examination with Papanicolaou smear of cervix Well on DepoProvera.  Occasional BTB, but overall less bleeding and dysmenorrhea.  Not bleeding currently.  No pelvic pain.  Stable partner, no pain with IC using KY, but low libido.  Pap 02/2021 Neg.  No h/o abnormal Pap. Pap reflex today.  Breasts normal.  Mammo Neg in 01/2020.  [facility].  BMI 36.2.  Colono this year at age 20.  Recommend strongly to quit smoking. - Cytology - PAP( [place])  2. S/P tubal ligation  3. Dysmenorrhea Well on DepoProvera.  Occasional BTB, but overall less bleeding and dysmenorrhea.  Not bleeding currently.  No pelvic pain.  Stable partner, no pain with IC using  KY.  4. Encounter for surveillance of injectable contraceptive Well on DepoProvera.  Occasional BTB, but overall less bleeding and dysmenorrhea. Wanted to switch to continuous Nuvaring, but cigarette smoker.  DepoProvera prescription sent to pharmacy.  5. Low libido Many years of low libido.  Counseling done.  Will attempt to quit smoking to have the option of switching from DepoProvera to Nuvaring.  Testosterone Rx discussed, declined at this time.  6. Current smoker  Strongly recommend to quit.  Other orders - medroxyPROGESTERone (DEPO-PROVERA) 150 MG/ML injection; Inject 1 mL (150 mg total) into the muscle every 3 (three) months.   Genia Del MD, 1:36 PM

## 2023-05-01 LAB — CYTOLOGY - PAP: Diagnosis: NEGATIVE

## 2023-05-27 ENCOUNTER — Ambulatory Visit
Admission: EM | Admit: 2023-05-27 | Discharge: 2023-05-27 | Disposition: A | Discharge status: Home / Self Care | Payer: BC Managed Care – PPO

## 2023-05-27 DIAGNOSIS — L0291 Cutaneous abscess, unspecified: Secondary | ICD-10-CM

## 2023-05-27 MED ORDER — DOXYCYCLINE HYCLATE 100 MG PO CAPS: 100.0000 mg | ORAL_CAPSULE | Freq: Two times a day (BID) | ORAL | 0 refills | Status: AC

## 2023-05-27 NOTE — ED Triage Notes (Signed)
"  I have and abscess under left breast". First noticed "Thursday". "These are recurrent in different areas, this one is very painful". No fever.

## 2023-05-27 NOTE — ED Provider Notes (Signed)
[facility]    CSN: [id number] Arrival date & time: 05/27/23  1305      History   Chief Complaint Chief Complaint  Patient presents with   Skin Problem    HPI Tayten Heber Lavette Yankovich is a 47 y.o. female.   Patient here today for evaluation of abscess under her left breast that she first noticed a few days ago.  She states initially area was very small but has grown in size and tenderness.  She states that she started noticing some drainage today.  She has been using warm compresses but until today had not improvement.  She has not any fever.  She denies any nausea or vomiting.  The history is provided by the patient.    Past Medical History:  Diagnosis Date   Atypical chest pain 02/09/2016   Palpitations 02/09/2016    Patient Active Problem List   Diagnosis Date Noted   Palpitations 02/09/2016   Atypical chest pain 02/09/2016   Obesity 02/18/2015    Past Surgical History:  Procedure Laterality Date   BREAST REDUCTION SURGERY     BREAST SURGERY     TUBAL LIGATION      OB History     Gravida  4   Para  4   Term      Preterm      AB  0   Living  4      SAB      IAB      Ectopic  0   Multiple      Live Births               Home Medications    Prior to Admission medications   Medication Sig Start Date End Date Taking? Authorizing Provider  Benzocaine (BOIL-EASE EX) Apply topically.   Yes [provider]  doxycycline (VIBRAMYCIN) 100 MG capsule Take 1 capsule (100 mg total) by mouth 2 (two) times daily for 7 days. 05/27/23 06/03/23 Yes Tomi Bamberger, PA-C  medroxyPROGESTERone (DEPO-PROVERA) 150 MG/ML injection Inject 1 mL (150 mg total) into the muscle every 3 (three) months. 04/27/23  Yes Genia Del, MD  clonazePAM (KLONOPIN) 0.5 MG tablet 1 tablet at bedtime and for stress as needed 03/28/21   [provider]  ibuprofen (ADVIL,MOTRIN) 600 MG tablet Take 1 tablet (600 mg total) by mouth every 8 (eight)  hours as needed. 01/16/18   Harrington Challenger, NP    Family History Family History  Problem Relation Age of Onset   Hypertension Father    Kidney disease Father    Cancer Maternal Aunt    Cancer Paternal Grandmother    Heart attack Mother    Stroke Mother    Hyperthyroidism Mother     Social History Social History   Tobacco Use   Smoking status: Every Day    Current packs/day: 0.00    Types: Cigarettes    Last attempt to quit: 07/19/2017    Years since quitting: 5.8   Smokeless tobacco: Never   Tobacco comments:    Cigarettes & vaping (more vaping than smoking)  Vaping Use   Vaping status: Never Used  Substance Use Topics   Alcohol use: Yes    Comment: occassionally   Drug use: No     Allergies   Patient has no known allergies.   Review of Systems Review of Systems  Constitutional:  Negative for chills and fever.  Eyes:  Negative for discharge and redness.  Respiratory:  Negative for shortness of breath.   Gastrointestinal:  Negative for nausea and vomiting.  Skin:  Positive for color change.     Physical Exam Triage Vital Signs ED Triage Vitals  Encounter Vitals Group     BP 05/27/23 1329 110/78     Systolic BP Percentile --      Diastolic BP Percentile --      Pulse Rate 05/27/23 1329 87     Resp 05/27/23 1329 18     Temp 05/27/23 1329 98.7 F (37.1 C)     Temp Source 05/27/23 1329 Oral     SpO2 05/27/23 1329 96 %     Weight 05/27/23 1327 209 lb (94.8 kg)     Height 05/27/23 1327 5\' 2"  (1.575 m)     Head Circumference --      Peak Flow --      Pain Score 05/27/23 1327 10     Pain Loc --      Pain Education --      Exclude from Growth Chart --    No data found.  Updated Vital Signs BP 110/78 (BP Location: Left Arm)   Pulse 87   Temp 98.7 F (37.1 C) (Oral)   Resp 18   Ht 5\' 2"  (1.575 m)   Wt 209 lb (94.8 kg)   LMP  (LMP Unknown)   SpO2 96%   BMI 38.23 kg/m       Physical Exam Vitals and nursing note reviewed.  Constitutional:       General: She is not in acute distress.    Appearance: Normal appearance. She is not ill-appearing.  HENT:     Head: Normocephalic and atraumatic.  Eyes:     Conjunctiva/sclera: Conjunctivae normal.  Cardiovascular:     Rate and Rhythm: Normal rate.  Pulmonary:     Effort: Pulmonary effort is normal.  Skin:    Comments: Area of erythema with mild induration noted to underside of left breast with small central wound with drainage noted on exam  Neurological:     Mental Status: She is alert.  Psychiatric:        Mood and Affect: Mood normal.        Behavior: Behavior normal.        Thought Content: Thought content normal.      UC Treatments / Results  Labs (all labs ordered are listed, but only abnormal results are displayed) Labs Reviewed - No data to display  EKG   Radiology No results found.  Procedures Procedures (including critical care time)  Medications Ordered in UC Medications - No data to display  Initial Impression / Assessment and Plan / UC Course  I have reviewed the triage vital signs and the nursing notes.  Pertinent labs & imaging results that were available during my care of the patient were reviewed by me and considered in my medical decision making (see chart for details).   Given spontaneous drainage no indication for incision and drainage in office today.  Recommended continued warm compresses and will treat with doxycycline.  Encouraged follow-up if no gradual improvement or with any further concerns.   Final Clinical Impressions(s) / UC Diagnoses   Final diagnoses:  Abscess     Discharge Instructions      Use warm compresses to continue to promote drainage. Follow up if no gradual improvement or with any further concerns or worsening symptoms.     ED Prescriptions     Medication Sig Dispense Auth. Provider  doxycycline (VIBRAMYCIN) 100 MG capsule Take 1 capsule (100 mg total) by mouth 2 (two) times daily for 7 days. 14 capsule  Tomi Bamberger, PA-C      PDMP not reviewed this encounter.   Tomi Bamberger, PA-C 05/27/23 1414

## 2023-05-27 NOTE — Discharge Instructions (Signed)
Use warm compresses to continue to promote drainage. Follow up if no gradual improvement or with any further concerns or worsening symptoms.

## 2023-06-29 ENCOUNTER — Ambulatory Visit (INDEPENDENT_AMBULATORY_CARE_PROVIDER_SITE_OTHER): Payer: BC Managed Care – PPO | Admitting: *Deleted

## 2023-06-29 DIAGNOSIS — Z3042 Encounter for surveillance of injectable contraceptive: Secondary | ICD-10-CM | POA: Diagnosis not present

## 2023-06-29 MED ORDER — MEDROXYPROGESTERONE ACETATE 150 MG/ML IM SUSP
150.0000 mg | Freq: Once | INTRAMUSCULAR | Status: AC
Start: 2023-06-29 — End: 2023-06-29
  Administered 2023-06-29: 150 mg via INTRAMUSCULAR

## 2023-08-11 ENCOUNTER — Other Ambulatory Visit: Payer: Self-pay

## 2023-08-11 ENCOUNTER — Ambulatory Visit
Admission: EM | Admit: 2023-08-11 | Discharge: 2023-08-11 | Disposition: A | Discharge status: Home / Self Care | Payer: BC Managed Care – PPO

## 2023-08-11 ENCOUNTER — Encounter: Payer: Self-pay | Admitting: *Deleted

## 2023-08-11 DIAGNOSIS — M25532 Pain in left wrist: Secondary | ICD-10-CM | POA: Diagnosis not present

## 2023-08-11 NOTE — ED Triage Notes (Signed)
States she has issues with both wrists and get cortisone shots. Since Thursday evening she has been having excruciating pain in left wrist. States pain is keeping her up and she has taken OTC meds without relief. Denies known injury

## 2023-08-11 NOTE — ED Notes (Addendum)
Updated pt on delay due to emergency in department. Pt states she is in a lot of pain and doesn't want to wait. She states is going to the orthopedic urgent care

## 2023-08-15 DIAGNOSIS — M25532 Pain in left wrist: Secondary | ICD-10-CM | POA: Diagnosis not present

## 2023-08-17 DIAGNOSIS — M25532 Pain in left wrist: Secondary | ICD-10-CM | POA: Diagnosis not present

## 2023-08-20 DIAGNOSIS — M67832 Other specified disorders of synovium, left wrist: Secondary | ICD-10-CM | POA: Diagnosis not present

## 2023-08-20 DIAGNOSIS — M65312 Trigger thumb, left thumb: Secondary | ICD-10-CM | POA: Diagnosis not present

## 2023-09-14 ENCOUNTER — Ambulatory Visit (INDEPENDENT_AMBULATORY_CARE_PROVIDER_SITE_OTHER): Payer: BC Managed Care – PPO

## 2023-09-14 DIAGNOSIS — Z3042 Encounter for surveillance of injectable contraceptive: Secondary | ICD-10-CM | POA: Diagnosis not present

## 2023-09-14 MED ORDER — MEDROXYPROGESTERONE ACETATE 150 MG/ML IM SUSY
150.0000 mg | PREFILLED_SYRINGE | Freq: Once | INTRAMUSCULAR | Status: AC
Start: 2023-09-14 — End: 2023-09-14
  Administered 2023-09-14: 150 mg via INTRAMUSCULAR

## 2023-11-30 ENCOUNTER — Ambulatory Visit (INDEPENDENT_AMBULATORY_CARE_PROVIDER_SITE_OTHER): Payer: BC Managed Care – PPO

## 2023-11-30 DIAGNOSIS — Z3042 Encounter for surveillance of injectable contraceptive: Secondary | ICD-10-CM

## 2023-11-30 MED ORDER — MEDROXYPROGESTERONE ACETATE 150 MG/ML IM SUSY
150.0000 mg | PREFILLED_SYRINGE | Freq: Once | INTRAMUSCULAR | Status: AC
Start: 2023-11-30 — End: 2023-11-30
  Administered 2023-11-30: 150 mg via INTRAMUSCULAR

## 2023-11-30 NOTE — Progress Notes (Signed)
Medroxyprogesterone acetate 150 mg injection given LUOQ.  Patient tolerated injection well.  Next injection is due 02/15/24-02/29/24

## 2023-12-12 DIAGNOSIS — J9801 Acute bronchospasm: Secondary | ICD-10-CM | POA: Diagnosis not present

## 2023-12-12 DIAGNOSIS — J45909 Unspecified asthma, uncomplicated: Secondary | ICD-10-CM | POA: Diagnosis not present

## 2024-02-04 DIAGNOSIS — Z0000 Encounter for general adult medical examination without abnormal findings: Secondary | ICD-10-CM | POA: Diagnosis not present

## 2024-02-11 DIAGNOSIS — Z0000 Encounter for general adult medical examination without abnormal findings: Secondary | ICD-10-CM | POA: Diagnosis not present

## 2024-02-11 DIAGNOSIS — F5101 Primary insomnia: Secondary | ICD-10-CM | POA: Diagnosis not present

## 2024-02-15 ENCOUNTER — Ambulatory Visit: Payer: BC Managed Care – PPO

## 2024-02-15 DIAGNOSIS — Z3042 Encounter for surveillance of injectable contraceptive: Secondary | ICD-10-CM

## 2024-02-15 MED ORDER — MEDROXYPROGESTERONE ACETATE 150 MG/ML IM SUSY
150.0000 mg | PREFILLED_SYRINGE | Freq: Once | INTRAMUSCULAR | Status: AC
Start: 2024-02-15 — End: 2024-02-15
  Administered 2024-02-15: 150 mg via INTRAMUSCULAR

## 2024-02-15 NOTE — Progress Notes (Signed)
 Medroxyprogesterone 150 mg given IM RUOQ.  Patient tolerated injection well.  Next injection is due 05/02/24-05/16/24.  AEX is due after 04/26/24.

## 2024-02-27 DIAGNOSIS — Z1212 Encounter for screening for malignant neoplasm of rectum: Secondary | ICD-10-CM | POA: Diagnosis not present

## 2024-02-27 DIAGNOSIS — Z1211 Encounter for screening for malignant neoplasm of colon: Secondary | ICD-10-CM | POA: Diagnosis not present

## 2024-04-04 ENCOUNTER — Telehealth: Payer: Self-pay

## 2024-04-04 NOTE — Telephone Encounter (Signed)
 Pt LVM in triage line stating that she would like to talk with someone regarding being on the depo shot and having bleeding now x 3 weeks.

## 2024-04-04 NOTE — Telephone Encounter (Signed)
 Per GH: "Please schedule appt."  No other clinical advice given from provider. Pls notify the pt and schedule OV. Thanks.

## 2024-04-04 NOTE — Telephone Encounter (Signed)
 Per EMR investigation, has not missed or been late for injection in the past year and last PUS in EMR from 2022.  Last received depo on 02/15/2024, not due until 6/27  LMP: 03/09/2024 Pt reports initially just seeing blood in urine and then spotty with wiping but this whole week has been more of a mod to light period. Having to change panty liner at least 3 times daily.   Pt also c/o intermittently lower back aches, usually noticeable more with increase of the flow.   Pt denies any recent additions to stress or taken abxs recently.   Pt feeling like something is really wrong.   Please advise.

## 2024-04-08 NOTE — Telephone Encounter (Signed)
 Pt scheduled for 04/11/2024. Encounter closed.

## 2024-04-11 ENCOUNTER — Ambulatory Visit: Admitting: Obstetrics and Gynecology

## 2024-04-11 VITALS — BP 104/64 | HR 86 | Temp 98.4°F | Wt 207.0 lb

## 2024-04-11 DIAGNOSIS — N939 Abnormal uterine and vaginal bleeding, unspecified: Secondary | ICD-10-CM

## 2024-04-11 DIAGNOSIS — N951 Menopausal and female climacteric states: Secondary | ICD-10-CM

## 2024-04-11 DIAGNOSIS — F17200 Nicotine dependence, unspecified, uncomplicated: Secondary | ICD-10-CM

## 2024-04-11 LAB — PREGNANCY, URINE: Preg Test, Ur: NEGATIVE

## 2024-04-11 MED ORDER — ESTRADIOL 2 MG PO TABS
2.0000 mg | ORAL_TABLET | Freq: Every day | ORAL | 0 refills | Status: DC
Start: 2024-04-11 — End: 2024-05-06

## 2024-04-11 MED ORDER — BIJUVA 1-100 MG PO CAPS
1.0000 | ORAL_CAPSULE | Freq: Every day | ORAL | 0 refills | Status: DC
Start: 2024-04-11 — End: 2024-04-11

## 2024-04-11 MED ORDER — BIJUVA 1-100 MG PO CAPS: 1.0000 | ORAL_CAPSULE | Freq: Every day | ORAL | 0 refills | Status: DC

## 2024-04-11 NOTE — Patient Instructions (Signed)
 Take estradiol tablets for 2 weeks then start bijuva (combined estrogen and progesterone). B[facility]va was [facility] to ensure the most affordable pricing.  This is a [employer].  Please contact us  if you have any questions about cost or receipt of prescription.

## 2024-04-11 NOTE — Progress Notes (Signed)
 48 y.o. G76P0004 female status post BTL on Depo-Provera  for AUB and dysmenorrhea with tobacco use here for AUB. Married.  No LMP recorded. Patient has had an injection.   Complains of abnormal vaginal bleeding on depo provera . Vaginal bleeding x's 4 weeks, now with light flow. Next depo provera  is due 05/02/24-05/16/24.   2022 TVUS unremarkable. Smokes 3-4 cig/day. Using for stress relief.  GYN HISTORY: No sig hx  OB History  Gravida Para Term Preterm AB Living  4 4   0 4  SAB IAB Ectopic Multiple Live Births    0      # Outcome Date GA Lbr Len/2nd Weight Sex Type Anes PTL Lv  4 Para           3 Para           2 Para           1 Para            Past Medical History:  Diagnosis Date   Atypical chest pain 02/09/2016   Palpitations 02/09/2016   Past Surgical History:  Procedure Laterality Date   BREAST REDUCTION SURGERY     BREAST SURGERY     TUBAL LIGATION     Current Outpatient Medications on File Prior to Visit  Medication Sig Dispense Refill   Benzocaine (BOIL-EASE EX) Apply topically.     clonazePAM (KLONOPIN) 0.5 MG tablet 1 tablet at bedtime and for stress as needed     ibuprofen  (ADVIL ,MOTRIN ) 600 MG tablet Take 1 tablet (600 mg total) by mouth every 8 (eight) hours as needed. 60 tablet 1   medroxyPROGESTERone  (DEPO-PROVERA ) 150 MG/ML injection Inject 1 mL (150 mg total) into the muscle every 3 (three) months. 1 mL 4   No current facility-administered medications on file prior to visit.   No Known Allergies    PE Today's Vitals   04/11/24 0841  BP: 104/64  Pulse: 86  Temp: 98.4 F (36.9 C)  TempSrc: Oral  SpO2: 98%  Weight: 207 lb (93.9 kg)   Body mass index is 37.86 kg/m.  Physical Exam Vitals reviewed. Exam conducted with a chaperone present.  Constitutional:      General: She is not in acute distress.    Appearance: Normal appearance.  HENT:     Head: Normocephalic and atraumatic.     Nose: Nose normal.   Eyes:     Extraocular Movements:  Extraocular movements intact.     Conjunctiva/sclera: Conjunctivae normal.   Pulmonary:     Effort: Pulmonary effort is normal.  Genitourinary:    General: Normal vulva.     Exam position: Lithotomy position.     Vagina: Normal. No vaginal discharge.     Cervix: Normal. No cervical motion tenderness, discharge or lesion.     Uterus: Normal. Not enlarged and not tender.      Adnexa: Right adnexa normal and left adnexa normal.     Comments: Light menses  Musculoskeletal:        General: Normal range of motion.     Cervical back: Normal range of motion.   Neurological:     General: No focal deficit present.     Mental Status: She is alert.   Psychiatric:        Mood and Affect: Mood normal.        Behavior: Behavior normal.      Assessment and Plan:        Abnormal vaginal bleeding -  Pregnancy, urine -     Estradiol ; Take 1 tablet (2 mg total) by mouth daily for 14 days.  Dispense: 14 tablet; Refill: 0 AUB on Depo. Take estrace  for 2wk then start bijuva .   Perimenopausal vasomotor symptoms Assessment & Plan: Briefly discussed management of VMS, including use of HRT, SSRI, and veozah. Discussed necessity of risk screening depending on class of treatment.  Patient is interested in HRT. Risk and benefits reviewed. Reviewed risk of breast cancer, DVT, and cardiovascular risk including stroke and MI. She is an appropriate candidate as she is perimenopausal or it has been less than 10 years since menopause. Discussed topical estrogen carries a lower risk of DVT and will prescribe progestin for endometrial protection unless patient had had a hysterectomy. All questions answered. RTO in 3 months.  Orders: -     Bijuva ; Take 1 capsule by mouth daily.  Dispense: 30 capsule; Refill: 0  Current smoker  Encouraged cesasation, reviewed increased risk of DVT with use of estrogen hormonal therapies.   Romaine Closs, MD

## 2024-04-23 ENCOUNTER — Encounter: Payer: Self-pay | Admitting: Obstetrics and Gynecology

## 2024-04-23 DIAGNOSIS — F17200 Nicotine dependence, unspecified, uncomplicated: Secondary | ICD-10-CM | POA: Insufficient documentation

## 2024-04-23 DIAGNOSIS — N951 Menopausal and female climacteric states: Secondary | ICD-10-CM | POA: Insufficient documentation

## 2024-04-23 NOTE — Assessment & Plan Note (Signed)
 Briefly discussed management of VMS, including use of HRT, SSRI, and veozah. Discussed necessity of risk screening depending on class of treatment.  Patient is interested in HRT. Risk and benefits reviewed. Reviewed risk of breast cancer, DVT, and cardiovascular risk including stroke and MI. She is an appropriate candidate as she is perimenopausal or it has been less than 10 years since menopause. Discussed topical estrogen carries a lower risk of DVT and will prescribe progestin for endometrial protection unless patient had had a hysterectomy. All questions answered. RTO in 3 months.

## 2024-05-04 ENCOUNTER — Other Ambulatory Visit: Payer: Self-pay | Admitting: Obstetrics and Gynecology

## 2024-05-04 DIAGNOSIS — N951 Menopausal and female climacteric states: Secondary | ICD-10-CM

## 2024-05-06 ENCOUNTER — Ambulatory Visit (INDEPENDENT_AMBULATORY_CARE_PROVIDER_SITE_OTHER): Admitting: Obstetrics and Gynecology

## 2024-05-06 ENCOUNTER — Encounter: Payer: Self-pay | Admitting: Obstetrics and Gynecology

## 2024-05-06 VITALS — BP 130/78 | HR 61 | Temp 98.1°F | Ht 63.0 in | Wt 205.0 lb

## 2024-05-06 DIAGNOSIS — Z01419 Encounter for gynecological examination (general) (routine) without abnormal findings: Secondary | ICD-10-CM | POA: Insufficient documentation

## 2024-05-06 DIAGNOSIS — Z3042 Encounter for surveillance of injectable contraceptive: Secondary | ICD-10-CM | POA: Diagnosis not present

## 2024-05-06 DIAGNOSIS — Z9851 Tubal ligation status: Secondary | ICD-10-CM

## 2024-05-06 DIAGNOSIS — F17200 Nicotine dependence, unspecified, uncomplicated: Secondary | ICD-10-CM

## 2024-05-06 DIAGNOSIS — Z1331 Encounter for screening for depression: Secondary | ICD-10-CM | POA: Diagnosis not present

## 2024-05-06 DIAGNOSIS — Z79890 Hormone replacement therapy (postmenopausal): Secondary | ICD-10-CM | POA: Insufficient documentation

## 2024-05-06 MED ORDER — MEDROXYPROGESTERONE ACETATE 150 MG/ML IM SUSP
150.0000 mg | Freq: Once | INTRAMUSCULAR | Status: AC
Start: 2024-05-06 — End: 2024-05-06
  Administered 2024-05-06: 150 mg via INTRAMUSCULAR

## 2024-05-06 NOTE — Patient Instructions (Signed)

## 2024-05-06 NOTE — Progress Notes (Signed)
 48 y.o. G37P4004 female s/p BTL with AUB, dysmenorrhea, tobacco use here for annual exam. Married. 4 boys.  Patient's last menstrual period was 05/06/2024 (exact date). Period Duration (Days): 5 Period Pattern: Regular Menstrual Flow: Moderate Menstrual Control: Maxi pad Dysmenorrhea: (!) Severe Dysmenorrhea Symptoms: Diarrhea, Headache  She reports hx of premenstrual boils, now resolved. Some vaginal odor Cramping really bad today and currently on menstrual period. Taking midol . Father recently admitted to hospital with bacteremia,started dialysis in march. Primary caregiver. Big source of stress.  Abnormal bleeding: As noted. Complains of abnormal vaginal bleeding on depo provera . Vaginal bleeding x's 4 weeks, now with light flow. Was going to switch to bijuva  but patient has not started due to increased tobacco use. Pelvic discharge or pain: as noted Breast mass, nipple discharge or skin changes : none  Sexually active: Yes Birth control: Injections Last PAP:     Component Value Date/Time   DIAGPAP  04/27/2023 1401    - Negative for intraepithelial lesion or malignancy (NILM)   DIAGPAP  02/28/2021 1513    - Negative for intraepithelial lesion or malignancy (NILM)   HPVHIGH Negative 02/28/2021 1513   ADEQPAP  04/27/2023 1401    Satisfactory for evaluation; transformation zone component PRESENT.   ADEQPAP  02/28/2021 1513    Satisfactory for evaluation; transformation zone component PRESENT.   Last mammogram: 01/12/20 Bi-Rads 2 Last colonoscopy: Colorguard at PCP - normal  Exercising: No Smoker: Yes  Flowsheet Row Office Visit from 05/06/2024 in [facility]  PHQ-2 Total Score 2      PHQ9: 5  GYN HISTORY: No sig hx  OB History  Gravida Para Term Preterm AB Living  4 4 4   0 4  SAB IAB Ectopic Multiple Live Births    0  4    # Outcome Date GA Lbr Len/2nd Weight Sex Type Anes PTL Lv  4 Term      Vag-Spont   LIV  3 Term      Vag-Spont    LIV  2 Term      Vag-Spont   LIV  1 Term      Vag-Spont   LIV   Past Medical History:  Diagnosis Date   Atypical chest pain 02/09/2016   Palpitations 02/09/2016   Past Surgical History:  Procedure Laterality Date   BREAST REDUCTION SURGERY     BREAST SURGERY     TUBAL LIGATION     Current Outpatient Medications on File Prior to Visit  Medication Sig Dispense Refill   clonazePAM (KLONOPIN) 0.5 MG tablet 1 tablet at bedtime and for stress as needed     ibuprofen  (ADVIL ,MOTRIN ) 600 MG tablet Take 1 tablet (600 mg total) by mouth every 8 (eight) hours as needed. 60 tablet 1   medroxyPROGESTERone  (DEPO-PROVERA ) 150 MG/ML injection Inject 1 mL (150 mg total) into the muscle every 3 (three) months. 1 mL 4   Benzocaine (BOIL-EASE EX) Apply topically. (Patient not taking: Reported on 05/06/2024)     No current facility-administered medications on file prior to visit.   Social History   Socioeconomic History   Marital status: Married    Spouse name: Not on file   Number of children: Not on file   Years of education: Not on file   Highest education level: Not on file  Occupational History   Not on file  Tobacco Use   Smoking status: Every Day    Current packs/day: 0.00    Types: Cigarettes  Last attempt to quit: 07/19/2017    Years since quitting: 6.8   Smokeless tobacco: Never   Tobacco comments:    Cigarettes & vaping (more vaping than smoking)  Vaping Use   Vaping status: Never Used  Substance and Sexual Activity   Alcohol use: Yes    Comment: occassionally   Drug use: No   Sexual activity: Yes    Birth control/protection: Surgical, Injection    Comment: BTL & depo  Other Topics Concern   Not on file  Social History Narrative   Not on file   Social Drivers of Health   Financial Resource Strain: Not on file  Food Insecurity: Not on file  Transportation Needs: Not on file  Physical Activity: Not on file  Stress: Not on file  Social Connections: Not on file   Intimate Partner Violence: Not on file   Family History  Problem Relation Age of Onset   Hypertension Father    Kidney disease Father    Cancer Maternal Aunt    Cancer Paternal Grandmother    Heart attack Mother    Stroke Mother    Hyperthyroidism Mother    No Known Allergies   PE Today's Vitals   05/06/24 1330  BP: 130/78  Pulse: 61  Temp: 98.1 F (36.7 C)  TempSrc: Oral  SpO2: 99%  Weight: 205 lb (93 kg)  Height: 5' 3 (1.6 m)   Body mass index is 36.31 kg/m.  Physical Exam Vitals reviewed. Exam conducted with a chaperone present.  Constitutional:      General: She is not in acute distress.    Appearance: Normal appearance.  HENT:     Head: Normocephalic and atraumatic.     Nose: Nose normal.   Eyes:     Extraocular Movements: Extraocular movements intact.     Conjunctiva/sclera: Conjunctivae normal.   Neck:     Thyroid : No thyroid  mass, thyromegaly or thyroid  tenderness.  Pulmonary:     Effort: Pulmonary effort is normal.  Chest:     Chest wall: No mass or tenderness.  Breasts:    Right: Normal. No swelling, mass, nipple discharge, skin change or tenderness.     Left: Normal. No swelling, mass, nipple discharge, skin change or tenderness.  Abdominal:     General: There is no distension.     Palpations: Abdomen is soft.     Tenderness: There is no abdominal tenderness.  Genitourinary:    General: Normal vulva.     Exam position: Lithotomy position.     Urethra: No prolapse.     Vagina: Normal. No vaginal discharge or bleeding.     Cervix: Normal. No lesion.     Uterus: Normal. Not enlarged and not tender.      Adnexa: Right adnexa normal and left adnexa normal.     Comments: Light menses present  Musculoskeletal:        General: Normal range of motion.     Cervical back: Normal range of motion.  Lymphadenopathy:     Upper Body:     Right upper body: No axillary adenopathy.     Left upper body: No axillary adenopathy.     Lower Body: No right  inguinal adenopathy. No left inguinal adenopathy.   Skin:    General: Skin is warm and dry.   Neurological:     General: No focal deficit present.     Mental Status: She is alert.   Psychiatric:        Mood and  Affect: Mood normal.        Behavior: Behavior normal.     Assessment and Plan:        Well woman exam with routine gynecological exam Assessment & Plan: Cervical cancer screening performed according to ASCCP guidelines. Encouraged annual mammogram screening Cologuard  UTD Labs and immunizations with her primary Encouraged safe sexual practices as indicated Encouraged healthy lifestyle practices with diet and exercise For patients under 50yo, I recommend 1000mg  calcium daily and 600IU of vitamin D daily.  S/P tubal ligation  Current smoker Encouraged cessation. Husband quit and works out regularly and has lost weigth.  Encounter for surveillance of injectable contraceptive AUB Wants to continuei Depo for now while working on smoking cessation -     medroxyPROGESTERone  Acetate   Vera LULLA Pa, MD

## 2024-05-06 NOTE — Assessment & Plan Note (Signed)
 Cervical cancer screening performed according to ASCCP guidelines. Encouraged annual mammogram screening Cologuard  UTD Labs and immunizations with her primary Encouraged safe sexual practices as indicated Encouraged healthy lifestyle practices with diet and exercise For patients under 46yo, I recommend 1000mg  calcium daily and 600IU of vitamin D daily.

## 2024-05-07 NOTE — Telephone Encounter (Signed)
 Cancelled.

## 2024-07-04 ENCOUNTER — Other Ambulatory Visit: Payer: Self-pay

## 2024-07-04 ENCOUNTER — Emergency Department (HOSPITAL_BASED_OUTPATIENT_CLINIC_OR_DEPARTMENT_OTHER)

## 2024-07-04 ENCOUNTER — Emergency Department (HOSPITAL_BASED_OUTPATIENT_CLINIC_OR_DEPARTMENT_OTHER)
Admission: EM | Admit: 2024-07-04 | Discharge: 2024-07-04 | Disposition: A | Discharge status: Home / Self Care | Attending: Emergency Medicine | Admitting: Emergency Medicine

## 2024-07-04 ENCOUNTER — Encounter (HOSPITAL_BASED_OUTPATIENT_CLINIC_OR_DEPARTMENT_OTHER): Payer: Self-pay | Admitting: Emergency Medicine

## 2024-07-04 DIAGNOSIS — R079 Chest pain, unspecified: Secondary | ICD-10-CM | POA: Diagnosis not present

## 2024-07-04 DIAGNOSIS — F17200 Nicotine dependence, unspecified, uncomplicated: Secondary | ICD-10-CM | POA: Insufficient documentation

## 2024-07-04 DIAGNOSIS — R0789 Other chest pain: Secondary | ICD-10-CM | POA: Insufficient documentation

## 2024-07-04 LAB — CBC WITH DIFFERENTIAL/PLATELET
Abs Immature Granulocytes: 0.01 K/uL (ref 0.00–0.07)
Basophils Absolute: 0 K/uL (ref 0.0–0.1)
Basophils Relative: 1 %
Eosinophils Absolute: 0.2 K/uL (ref 0.0–0.5)
Eosinophils Relative: 5 %
HCT: 37 % (ref 36.0–46.0)
Hemoglobin: 12.3 g/dL (ref 12.0–15.0)
Immature Granulocytes: 0 %
Lymphocytes Relative: 42 %
Lymphs Abs: 2.2 K/uL (ref 0.7–4.0)
MCH: 33.1 pg (ref 26.0–34.0)
MCHC: 33.2 g/dL (ref 30.0–36.0)
MCV: 99.5 fL (ref 80.0–100.0)
Monocytes Absolute: 0.3 K/uL (ref 0.1–1.0)
Monocytes Relative: 5 %
Neutro Abs: 2.5 K/uL (ref 1.7–7.7)
Neutrophils Relative %: 47 %
Platelets: 273 K/uL (ref 150–400)
RBC: 3.72 MIL/uL — ABNORMAL LOW (ref 3.87–5.11)
RDW: 11.8 % (ref 11.5–15.5)
WBC: 5.2 K/uL (ref 4.0–10.5)
nRBC: 0 % (ref 0.0–0.2)

## 2024-07-04 LAB — COMPREHENSIVE METABOLIC PANEL WITH GFR
ALT: 15 U/L (ref 0–44)
AST: 15 U/L (ref 15–41)
Albumin: 4.4 g/dL (ref 3.5–5.0)
Alkaline Phosphatase: 28 U/L — ABNORMAL LOW (ref 38–126)
Anion gap: 11 (ref 5–15)
BUN: 7 mg/dL (ref 6–20)
CO2: 23 mmol/L (ref 22–32)
Calcium: 9.6 mg/dL (ref 8.9–10.3)
Chloride: 103 mmol/L (ref 98–111)
Creatinine, Ser: 1.08 mg/dL — ABNORMAL HIGH (ref 0.44–1.00)
GFR, Estimated: >60 mL/min (ref >60)
Glucose, Bld: 108 mg/dL — ABNORMAL HIGH (ref 70–99)
Potassium: 4 mmol/L (ref 3.5–5.1)
Sodium: 138 mmol/L (ref 135–145)
Total Bilirubin: 0.5 mg/dL (ref 0.0–1.2)
Total Protein: 7.7 g/dL (ref 6.5–8.1)

## 2024-07-04 LAB — TROPONIN T, HIGH SENSITIVITY
Troponin T High Sensitivity: <15 ng/L (ref 0–19)
Troponin T High Sensitivity: <15 ng/L (ref 0–19)

## 2024-07-04 LAB — D-DIMER, QUANTITATIVE: D-Dimer, Quant: 0.48 ug{FEU}/mL (ref 0.00–0.50)

## 2024-07-04 MED ORDER — METHYLPREDNISOLONE 4 MG PO TBPK: ORAL_TABLET | ORAL | 0 refills | Status: AC

## 2024-07-04 MED ORDER — ACETAMINOPHEN 500 MG PO TABS
1000.0000 mg | ORAL_TABLET | Freq: Once | ORAL | Status: AC
  Administered 2024-07-04: 1000 mg via ORAL
  Filled 2024-07-04: qty 2

## 2024-07-04 NOTE — Discharge Instructions (Addendum)
 Return if symptoms worsen.  Take Medrol  Dosepak as prescribed.  Follow-up with your primary care doctor.

## 2024-07-04 NOTE — ED Provider Notes (Signed)
  EMERGENCY DEPARTMENT AT [facility] Provider Note   CSN: [id number] Arrival date & time: 07/04/24  9258     Patient presents with: Chest Pain   Sharon Bryant is a 48 y.o. female.   Patient here with left-sided chest pain constant for the last 3 days.  She is having some tingling in her thumb second finger third digit here this morning as well.  Nothing makes it worse or better.  Is not on any estrogen birth control.  No recent surgery or travel.  CAD history in the family.  She has had a negative stress test in the past.  She does smoke.  Denies any cough sputum production.  Denies any headache.  Not sure if the pain is worse with movement.  Denies any trauma.  Denies any cocaine or alcohol use.  Denies any abdominal pain.  Pain does not go into the back.  No nausea vomit diarrhea.  The history is provided by the patient.       Prior to Admission medications   Medication Sig Start Date End Date Taking? Authorizing Provider  methylPREDNISolone  (MEDROL  DOSEPAK) 4 MG TBPK tablet Follow package insert 07/04/24  Yes Tanekia Ryans, DO  Benzocaine (BOIL-EASE EX) Apply topically. Patient not taking: Reported on 05/06/2024    [provider]  clonazePAM (KLONOPIN) 0.5 MG tablet 1 tablet at bedtime and for stress as needed 03/28/21   [provider]  ibuprofen  (ADVIL ,MOTRIN ) 600 MG tablet Take 1 tablet (600 mg total) by mouth every 8 (eight) hours as needed. 01/16/18   Neysa Inocente PARAS, NP  medroxyPROGESTERone  (DEPO-PROVERA ) 150 MG/ML injection Inject 1 mL (150 mg total) into the muscle every 3 (three) months. 04/27/23   Lavoie, Marie-Lyne, MD    Allergies: Patient has no known allergies.    Review of Systems  Updated Vital Signs BP (!) 141/81 (BP Location: Right Arm)   Pulse 70   Temp 98.2 F (36.8 C) (Oral)   Resp (!) 22   Ht 5' 3 (1.6 m)   Wt 94.8 kg   SpO2 100%   BMI 37.02 kg/m   Physical Exam Vitals and nursing note reviewed.   Constitutional:      General: She is not in acute distress.    Appearance: She is well-developed. She is not ill-appearing.  HENT:     Head: Normocephalic and atraumatic.  Eyes:     Extraocular Movements: Extraocular movements intact.     Conjunctiva/sclera: Conjunctivae normal.     Pupils: Pupils are equal, round, and reactive to light.  Cardiovascular:     Rate and Rhythm: Normal rate and regular rhythm.     Pulses:          Radial pulses are 2+ on the right side and 2+ on the left side.     Heart sounds: Normal heart sounds. No murmur heard. Pulmonary:     Effort: Pulmonary effort is normal. No respiratory distress.     Breath sounds: Normal breath sounds.  Chest:     Chest wall: Tenderness present.     Comments: She is little bit tender when I press on her left upper chest, no midline spinal tenderness Abdominal:     Palpations: Abdomen is soft.     Tenderness: There is no abdominal tenderness.  Musculoskeletal:        General: No swelling. Normal range of motion.     Cervical back: Normal range of motion and neck supple.  Right lower leg: No edema.     Left lower leg: No edema.  Skin:    General: Skin is warm and dry.     Capillary Refill: Capillary refill takes less than 2 seconds.  Neurological:     Mental Status: She is alert.     Comments: Some decrease sensation to the left thumb index finger and middle finger but otherwise normal sensation throughout, 5+ out of 5 strength, normal speech normal visual fields normal coordination  Psychiatric:        Mood and Affect: Mood normal.     (all labs ordered are listed, but only abnormal results are displayed) Labs Reviewed  CBC WITH DIFFERENTIAL/PLATELET - Abnormal; Notable for the following components:      Result Value   RBC 3.72 (*)    All other components within normal limits  COMPREHENSIVE METABOLIC PANEL WITH GFR - Abnormal; Notable for the following components:   Glucose, Bld 108 (*)    Creatinine, Ser  1.08 (*)    Alkaline Phosphatase 28 (*)    All other components within normal limits  D-DIMER, QUANTITATIVE  TROPONIN T, HIGH SENSITIVITY  TROPONIN T, HIGH SENSITIVITY    EKG: EKG Interpretation Date/Time:  Friday July 04 2024 07:52:49 EDT Ventricular Rate:  71 PR Interval:  151 QRS Duration:  95 QT Interval:  380 QTC Calculation: 413 R Axis:   62  Text Interpretation: Sinus rhythm Low voltage, precordial leads Confirmed by Ruthe Cornet [phone]) on 07/04/2024 7:57:03 AM  Radiology: [facility] Chest Portable 1 View Result Date: 07/04/2024 CLINICAL DATA:  Chest pain EXAM: PORTABLE CHEST 1 VIEW COMPARISON:  01/18/2016 FINDINGS: The lungs appear clear. Cardiac and mediastinal contours normal. No blunting of the costophrenic angles. IMPRESSION: 1. No active cardiopulmonary disease is radiographically apparent. Electronically Signed   By: Ryan Salvage M.D.   On: 07/04/2024 08:43     Procedures   Medications Ordered in the ED  acetaminophen  (TYLENOL ) tablet 1,000 mg (1,000 mg Oral Given 07/04/24 0807)                                    Medical Decision Making Amount and/or Complexity of Data Reviewed Labs: ordered. Radiology: ordered.  Risk OTC drugs. Prescription drug management.   Sharon Bryant is here with chest pain.  Normal vitals.  No fever.  History of smoking.  Differential diagnosis likely muscular process or GI related process.  She does smoke.  Heart score 2.  EKG shows sinus rhythm.  No ischemic changes.  Seems less likely to be ACS PE.  Have no concern clinically for dissection at this time.  Seems less likely to be pneumonia.  Overall we will get Tylenol .  Check CBC CMP troponin D-dimer chest x-ray.  She is neurovascular neuromuscular intact on exam.  She had little bit of a subjective numbness to her left thumb index finger middle finger but otherwise normal sensation otherwise.  Not sure if that some carpal tunnel.  There may be some paresthesias.  But  she has normal neurological exam otherwise.  Have no concern for stroke or other acute neurologic emergency.  Overall lab work is unremarkable.  No significant leukocytosis anemia or electrolyte abnormality.  D-dimer normal.  Troponin is normal.  Chest x-ray without any evidence of pneumonia or pneumothorax.  Overall I do think that this is muscular in nature.  Will prescribe Medrol  Dosepak.  Follow-up with  primary care.  Return if symptoms worsen.  Discharge.  This chart was dictated using voice recognition software.  Despite best efforts to proofread,  errors can occur which can change the documentation meaning.      Final diagnoses:  Atypical chest pain    ED Discharge Orders          Ordered    methylPREDNISolone  (MEDROL  DOSEPAK) 4 MG TBPK tablet        07/04/24 1041               Shaia Porath, DO 07/04/24 1041

## 2024-07-04 NOTE — ED Triage Notes (Signed)
 Pt caox4, ambulatory NAD c/o CP on L side since Wed with some numbness in L hand.

## 2024-07-22 ENCOUNTER — Ambulatory Visit

## 2024-07-28 ENCOUNTER — Ambulatory Visit (INDEPENDENT_AMBULATORY_CARE_PROVIDER_SITE_OTHER)

## 2024-07-28 DIAGNOSIS — Z3042 Encounter for surveillance of injectable contraceptive: Secondary | ICD-10-CM

## 2024-07-28 MED ORDER — MEDROXYPROGESTERONE ACETATE 150 MG/ML IM SUSY
150.0000 mg | PREFILLED_SYRINGE | Freq: Once | INTRAMUSCULAR | Status: AC
Start: 2024-07-28 — End: 2024-07-28
  Administered 2024-07-28: 150 mg via INTRAMUSCULAR

## 2024-07-28 NOTE — Progress Notes (Signed)
 Depo given on 07/28/24 In the RUOQ due 12/8-12/22. Patient tolerated injection well.

## 2024-10-13 ENCOUNTER — Ambulatory Visit (INDEPENDENT_AMBULATORY_CARE_PROVIDER_SITE_OTHER)

## 2024-10-13 DIAGNOSIS — Z3042 Encounter for surveillance of injectable contraceptive: Secondary | ICD-10-CM | POA: Diagnosis not present

## 2024-10-13 MED ORDER — MEDROXYPROGESTERONE ACETATE 150 MG/ML IM SUSP
150.0000 mg | Freq: Once | INTRAMUSCULAR | Status: AC
  Administered 2024-10-13: 150 mg via INTRAMUSCULAR

## 2024-12-29 ENCOUNTER — Ambulatory Visit
